# Patient Record
Sex: Female | Born: 1955 | Hispanic: No | Marital: Married | Smoking: Never smoker
Health system: Southern US, Community
[De-identification: ages and names within clinical notes are randomized; demographics above are authoritative.]

## PROBLEM LIST (undated history)

## (undated) DIAGNOSIS — I341 Nonrheumatic mitral (valve) prolapse: Secondary | ICD-10-CM

## (undated) DIAGNOSIS — C50919 Malignant neoplasm of unspecified site of unspecified female breast: Secondary | ICD-10-CM

## (undated) DIAGNOSIS — I10 Essential (primary) hypertension: Secondary | ICD-10-CM

## (undated) HISTORY — DX: Malignant neoplasm of unspecified site of unspecified female breast: C50.919

## (undated) HISTORY — PX: OTHER SURGICAL HISTORY: SHX169

## (undated) HISTORY — DX: Nonrheumatic mitral (valve) prolapse: I34.1

---

## 2014-08-23 ENCOUNTER — Other Ambulatory Visit: Payer: Self-pay | Admitting: Family Medicine

## 2014-08-23 ENCOUNTER — Other Ambulatory Visit (HOSPITAL_COMMUNITY)
Admission: RE | Admit: 2014-08-23 | Discharge: 2014-08-23 | Disposition: A | Discharge status: Home / Self Care | Payer: BC Managed Care – PPO | Source: Ambulatory Visit | Attending: Family Medicine | Admitting: Family Medicine

## 2014-08-23 ENCOUNTER — Other Ambulatory Visit: Payer: Self-pay

## 2014-08-23 DIAGNOSIS — Z124 Encounter for screening for malignant neoplasm of cervix: Secondary | ICD-10-CM | POA: Insufficient documentation

## 2014-08-23 DIAGNOSIS — Z1231 Encounter for screening mammogram for malignant neoplasm of breast: Secondary | ICD-10-CM

## 2014-08-27 LAB — CYTOLOGY - PAP

## 2014-09-13 ENCOUNTER — Ambulatory Visit
Admission: RE | Admit: 2014-09-13 | Discharge: 2014-09-13 | Disposition: A | Discharge status: Home / Self Care | Payer: BC Managed Care – PPO | Source: Ambulatory Visit

## 2014-09-13 DIAGNOSIS — Z1231 Encounter for screening mammogram for malignant neoplasm of breast: Secondary | ICD-10-CM

## 2014-09-19 ENCOUNTER — Encounter: Payer: Self-pay | Admitting: *Deleted

## 2014-09-21 ENCOUNTER — Encounter: Payer: Self-pay | Admitting: Cardiovascular Disease

## 2014-09-21 ENCOUNTER — Ambulatory Visit (INDEPENDENT_AMBULATORY_CARE_PROVIDER_SITE_OTHER): Payer: BC Managed Care – PPO | Admitting: Cardiovascular Disease

## 2014-09-21 VITALS — BP 110/60 | HR 76 | Ht 60.0 in | Wt 121.4 lb

## 2014-09-21 DIAGNOSIS — I10 Essential (primary) hypertension: Secondary | ICD-10-CM

## 2014-09-21 DIAGNOSIS — N184 Chronic kidney disease, stage 4 (severe): Secondary | ICD-10-CM

## 2014-09-21 DIAGNOSIS — I5022 Chronic systolic (congestive) heart failure: Secondary | ICD-10-CM | POA: Insufficient documentation

## 2014-09-21 MED ORDER — LISINOPRIL 20 MG PO TABS: 20.0000 mg | ORAL_TABLET | Freq: Every day | ORAL | Status: DC

## 2014-09-21 MED ORDER — METOPROLOL SUCCINATE ER 25 MG PO TB24: 25.0000 mg | ORAL_TABLET | Freq: Every day | ORAL | Status: DC

## 2014-09-21 NOTE — Patient Instructions (Addendum)
Stop taking Coversyl and Vastarel.  Start Toprol XL (metoprolol succinate) 25mg  daily.   Start Lisinopril 20mg  daily.   Your physician wants you to follow-up in: 6 months with Dr Elease HashimotoNahser. (June 2016).  You will receive a reminder letter in the mail two months in advance. If you don't receive a letter, please call our office to schedule the follow-up appointment.

## 2014-09-21 NOTE — Progress Notes (Addendum)
     Winfield RastJennie Jeffreys Date of Birth  [birth year]       [facility]    [place] [facility] [address]  [address], [address] [place], [place]  [address]   [place]  [id number] [phone]     [phone]   Fax  [phone]     Fax [phone]  Problem List: 1. HTN  History of Present Illness:  Tinnie GensJennie is a 58 yo , referred by Dr. Alfredia FergusonKnodi for evaluation of HTN  She just got married in June to her husband, Wallace CullensGray.  She is here today to establish care in the [place].  [place] is [place]. She exercises in the AM.  Works doing nails. No pain with walking or brinnging groceries from the car. Has slight pain seems to improve with her morning exercises.  Does not eat much salt.    Current Outpatient Prescriptions on File Prior to Visit  Medication Sig Dispense Refill  . Multiple Vitamin (MULTIVITAMIN) capsule Take 1 capsule by mouth daily.     No current facility-administered medications on file prior to visit.    No Known Allergies  Past Medical History  Diagnosis Date  . MVP (mitral valve prolapse)     questionable    Past Surgical History  Procedure Laterality Date  . No surgical hx      History  Smoking status  . Never Smoker   Smokeless tobacco  . Not on file    History  Alcohol Use  . 0.6 oz/week  . 1 Glasses of wine per week    Family History  Problem Relation Age of Onset  . Liver cancer Mother     Reviw of Systems:  Reviewed in the HPI.  All other systems are negative.  Physical Exam: Blood pressure 110/60, pulse 76, height 5' (1.524 m), weight 121 lb 6.4 oz (55.067 kg). Wt Readings from Last 3 Encounters:  09/21/14 121 lb 6.4 oz (55.067 kg)     General: Well developed, well nourished, in no acute distress.  Head: Normocephalic, atraumatic, sclera non-icteric, mucus membranes are moist,   Neck: Supple. Carotids are 2 + without bruits. No JVD   Lungs: Clear   Heart: RR, soft systolic murmur  Abdomen: Soft,  non-tender, non-distended with normal bowel sounds.  Msk:  Strength and tone are normal   Extremities: No clubbing or cyanosis. No edema.  Distal pedal pulses are 2+ and equal    Neuro: CN II - XII intact.  Alert and oriented X 3.   Psych:  Normal   ECG: Dec. 18, 2015;  NSR at 75  Assessment / Plan:

## 2014-09-21 NOTE — Assessment & Plan Note (Signed)
She has chronic systolic CHF. She has improved with medical therapy but her renal insufficiency and elevated BP is likely limiting her improvement. Will start her on Demadex 20 mg BID.  Will check BMP in 3 weeks.  She will need to be seen in 6 weeks.    She needs to get established with a nephrologist. I think her HTN will resolve after she starts dialysis.  We discussed her need for dialysis some day. She became somewhat teary eyed but realizes that it is in her future.

## 2015-01-25 ENCOUNTER — Encounter: Payer: Self-pay | Admitting: Cardiovascular Disease

## 2015-05-04 ENCOUNTER — Other Ambulatory Visit: Payer: Self-pay | Admitting: Cardiovascular Disease

## 2017-10-14 ENCOUNTER — Other Ambulatory Visit: Payer: Self-pay | Admitting: Family Medicine

## 2017-10-14 ENCOUNTER — Other Ambulatory Visit (HOSPITAL_COMMUNITY)
Admission: RE | Admit: 2017-10-14 | Discharge: 2017-10-14 | Disposition: A | Discharge status: Home / Self Care | Payer: Self-pay | Source: Ambulatory Visit | Attending: Family Medicine | Admitting: Family Medicine

## 2017-10-14 DIAGNOSIS — Z1231 Encounter for screening mammogram for malignant neoplasm of breast: Secondary | ICD-10-CM

## 2017-10-14 DIAGNOSIS — Z01411 Encounter for gynecological examination (general) (routine) with abnormal findings: Secondary | ICD-10-CM | POA: Insufficient documentation

## 2017-10-19 ENCOUNTER — Ambulatory Visit: Payer: Self-pay

## 2017-10-19 LAB — CYTOLOGY - PAP: DIAGNOSIS: NEGATIVE

## 2019-11-26 ENCOUNTER — Ambulatory Visit: Payer: Self-pay | Attending: Internal Medicine

## 2019-11-26 DIAGNOSIS — Z23 Encounter for immunization: Secondary | ICD-10-CM | POA: Insufficient documentation

## 2019-11-26 NOTE — Progress Notes (Signed)
   Covid-19 Vaccination Clinic  Name:  Caitlin Burch    MRN: [id number] DOB: 05-14-1956  11/26/2019  Caitlin Burch was observed post Covid-19 immunization for 15 minutes without incidence. She was provided with Vaccine Information Sheet and instruction to access the V-Safe system.   Caitlin Burch was instructed to call 911 with any severe reactions post vaccine: Marland Kitchen Difficulty breathing  . Swelling of your face and throat  . A fast heartbeat  . A bad rash all over your body  . Dizziness and weakness    Immunizations Administered    Name Date Dose VIS Date Route   Pfizer COVID-19 Vaccine 11/26/2019  3:08 PM 0.3 mL 09/15/2019 Intramuscular   Manufacturer: [employer], [employer]   Lot: [id number]   NDC: [address]-3

## 2019-12-27 ENCOUNTER — Ambulatory Visit: Payer: Self-pay | Attending: Internal Medicine

## 2019-12-27 DIAGNOSIS — Z23 Encounter for immunization: Secondary | ICD-10-CM

## 2019-12-27 NOTE — Progress Notes (Signed)
   Covid-19 Vaccination Clinic  Name:  Pennye Beeghly    MRN: [id number] DOB: [birth year]  12/27/2019  Ms. Stange was observed post Covid-19 immunization for 15 minutes without incident. She was provided with Vaccine Information Sheet and instruction to access the V-Safe system.   Ms. Wanamaker was instructed to call 911 with any severe reactions post vaccine: Marland Kitchen Difficulty breathing  . Swelling of face and throat  . A fast heartbeat  . A bad rash all over body  . Dizziness and weakness   Immunizations Administered    Name Date Dose VIS Date Route   Pfizer COVID-19 Vaccine 12/27/2019  3:07 PM 0.3 mL 09/15/2019 Intramuscular   Manufacturer: [employer], [employer]   Lot: [id number]   NDC: [address]-1

## 2020-12-19 DIAGNOSIS — R69 Illness, unspecified: Secondary | ICD-10-CM | POA: Diagnosis not present

## 2020-12-19 DIAGNOSIS — I10 Essential (primary) hypertension: Secondary | ICD-10-CM | POA: Diagnosis not present

## 2020-12-19 DIAGNOSIS — Z809 Family history of malignant neoplasm, unspecified: Secondary | ICD-10-CM | POA: Diagnosis not present

## 2021-05-01 ENCOUNTER — Other Ambulatory Visit: Payer: Self-pay | Admitting: Family Medicine

## 2021-05-01 DIAGNOSIS — R7303 Prediabetes: Secondary | ICD-10-CM | POA: Diagnosis not present

## 2021-05-01 DIAGNOSIS — Z1231 Encounter for screening mammogram for malignant neoplasm of breast: Secondary | ICD-10-CM

## 2021-05-01 DIAGNOSIS — E041 Nontoxic single thyroid nodule: Secondary | ICD-10-CM | POA: Diagnosis not present

## 2021-05-01 DIAGNOSIS — Z79899 Other long term (current) drug therapy: Secondary | ICD-10-CM | POA: Diagnosis not present

## 2021-05-01 DIAGNOSIS — I10 Essential (primary) hypertension: Secondary | ICD-10-CM | POA: Diagnosis not present

## 2021-05-01 DIAGNOSIS — E785 Hyperlipidemia, unspecified: Secondary | ICD-10-CM | POA: Diagnosis not present

## 2021-05-01 DIAGNOSIS — E559 Vitamin D deficiency, unspecified: Secondary | ICD-10-CM | POA: Diagnosis not present

## 2021-05-01 DIAGNOSIS — Z23 Encounter for immunization: Secondary | ICD-10-CM | POA: Diagnosis not present

## 2021-05-01 DIAGNOSIS — K760 Fatty (change of) liver, not elsewhere classified: Secondary | ICD-10-CM | POA: Diagnosis not present

## 2021-05-01 DIAGNOSIS — Z0000 Encounter for general adult medical examination without abnormal findings: Secondary | ICD-10-CM | POA: Diagnosis not present

## 2021-05-01 DIAGNOSIS — Z01818 Encounter for other preprocedural examination: Secondary | ICD-10-CM | POA: Diagnosis not present

## 2021-05-07 ENCOUNTER — Other Ambulatory Visit (HOSPITAL_COMMUNITY): Payer: Self-pay | Admitting: Family Medicine

## 2021-05-27 ENCOUNTER — Ambulatory Visit (HOSPITAL_COMMUNITY)
Admission: RE | Admit: 2021-05-27 | Discharge: 2021-05-27 | Disposition: A | Discharge status: Home / Self Care | Payer: Self-pay | Source: Ambulatory Visit | Attending: Family Medicine | Admitting: Family Medicine

## 2021-05-27 ENCOUNTER — Other Ambulatory Visit: Payer: Self-pay

## 2021-05-27 DIAGNOSIS — I700 Atherosclerosis of aorta: Secondary | ICD-10-CM | POA: Insufficient documentation

## 2021-05-27 DIAGNOSIS — Z136 Encounter for screening for cardiovascular disorders: Secondary | ICD-10-CM | POA: Insufficient documentation

## 2021-05-27 DIAGNOSIS — E785 Hyperlipidemia, unspecified: Secondary | ICD-10-CM | POA: Insufficient documentation

## 2021-09-01 DIAGNOSIS — Z79899 Other long term (current) drug therapy: Secondary | ICD-10-CM | POA: Diagnosis not present

## 2021-09-01 DIAGNOSIS — E2839 Other primary ovarian failure: Secondary | ICD-10-CM | POA: Diagnosis not present

## 2021-09-01 DIAGNOSIS — R7301 Impaired fasting glucose: Secondary | ICD-10-CM | POA: Diagnosis not present

## 2021-09-01 DIAGNOSIS — E785 Hyperlipidemia, unspecified: Secondary | ICD-10-CM | POA: Diagnosis not present

## 2021-09-03 ENCOUNTER — Other Ambulatory Visit: Payer: Self-pay | Admitting: Family Medicine

## 2021-09-03 DIAGNOSIS — E2839 Other primary ovarian failure: Secondary | ICD-10-CM

## 2021-10-23 ENCOUNTER — Ambulatory Visit
Admission: RE | Admit: 2021-10-23 | Discharge: 2021-10-23 | Disposition: A | Discharge status: Home / Self Care | Payer: Medicare HMO | Source: Ambulatory Visit | Attending: Family Medicine | Admitting: Family Medicine

## 2021-10-23 DIAGNOSIS — Z1231 Encounter for screening mammogram for malignant neoplasm of breast: Secondary | ICD-10-CM

## 2022-02-10 ENCOUNTER — Ambulatory Visit
Admission: RE | Admit: 2022-02-10 | Discharge: 2022-02-10 | Disposition: A | Discharge status: Home / Self Care | Payer: Medicare HMO | Source: Ambulatory Visit | Attending: Family Medicine | Admitting: Family Medicine

## 2022-02-10 DIAGNOSIS — Z780 Asymptomatic menopausal state: Secondary | ICD-10-CM | POA: Diagnosis not present

## 2022-02-10 DIAGNOSIS — E2839 Other primary ovarian failure: Secondary | ICD-10-CM

## 2022-02-10 DIAGNOSIS — M810 Age-related osteoporosis without current pathological fracture: Secondary | ICD-10-CM | POA: Diagnosis not present

## 2022-02-10 DIAGNOSIS — M85852 Other specified disorders of bone density and structure, left thigh: Secondary | ICD-10-CM | POA: Diagnosis not present

## 2022-05-25 DIAGNOSIS — I10 Essential (primary) hypertension: Secondary | ICD-10-CM | POA: Diagnosis not present

## 2022-05-25 DIAGNOSIS — E041 Nontoxic single thyroid nodule: Secondary | ICD-10-CM | POA: Diagnosis not present

## 2022-05-25 DIAGNOSIS — E785 Hyperlipidemia, unspecified: Secondary | ICD-10-CM | POA: Diagnosis not present

## 2022-05-25 DIAGNOSIS — R7303 Prediabetes: Secondary | ICD-10-CM | POA: Diagnosis not present

## 2022-05-25 DIAGNOSIS — Z79899 Other long term (current) drug therapy: Secondary | ICD-10-CM | POA: Diagnosis not present

## 2022-05-25 DIAGNOSIS — E559 Vitamin D deficiency, unspecified: Secondary | ICD-10-CM | POA: Diagnosis not present

## 2022-05-27 DIAGNOSIS — E559 Vitamin D deficiency, unspecified: Secondary | ICD-10-CM | POA: Diagnosis not present

## 2022-05-27 DIAGNOSIS — Z0000 Encounter for general adult medical examination without abnormal findings: Secondary | ICD-10-CM | POA: Diagnosis not present

## 2022-05-27 DIAGNOSIS — E785 Hyperlipidemia, unspecified: Secondary | ICD-10-CM | POA: Diagnosis not present

## 2022-05-27 DIAGNOSIS — E041 Nontoxic single thyroid nodule: Secondary | ICD-10-CM | POA: Diagnosis not present

## 2022-05-27 DIAGNOSIS — K760 Fatty (change of) liver, not elsewhere classified: Secondary | ICD-10-CM | POA: Diagnosis not present

## 2022-05-27 DIAGNOSIS — I2510 Atherosclerotic heart disease of native coronary artery without angina pectoris: Secondary | ICD-10-CM | POA: Diagnosis not present

## 2022-05-27 DIAGNOSIS — R7303 Prediabetes: Secondary | ICD-10-CM | POA: Diagnosis not present

## 2022-05-27 DIAGNOSIS — M818 Other osteoporosis without current pathological fracture: Secondary | ICD-10-CM | POA: Diagnosis not present

## 2022-05-27 DIAGNOSIS — Z79899 Other long term (current) drug therapy: Secondary | ICD-10-CM | POA: Diagnosis not present

## 2022-05-27 DIAGNOSIS — I700 Atherosclerosis of aorta: Secondary | ICD-10-CM | POA: Diagnosis not present

## 2022-05-27 DIAGNOSIS — I10 Essential (primary) hypertension: Secondary | ICD-10-CM | POA: Diagnosis not present

## 2022-11-27 ENCOUNTER — Other Ambulatory Visit: Payer: Self-pay | Admitting: Family Medicine

## 2022-11-27 DIAGNOSIS — Z1231 Encounter for screening mammogram for malignant neoplasm of breast: Secondary | ICD-10-CM

## 2023-01-11 ENCOUNTER — Ambulatory Visit
Admission: RE | Admit: 2023-01-11 | Discharge: 2023-01-11 | Disposition: A | Discharge status: Home / Self Care | Payer: Medicare HMO | Source: Ambulatory Visit | Attending: Family Medicine | Admitting: Family Medicine

## 2023-01-11 DIAGNOSIS — Z1231 Encounter for screening mammogram for malignant neoplasm of breast: Secondary | ICD-10-CM | POA: Diagnosis not present

## 2023-01-13 ENCOUNTER — Other Ambulatory Visit: Payer: Self-pay | Admitting: Family Medicine

## 2023-01-13 DIAGNOSIS — R928 Other abnormal and inconclusive findings on diagnostic imaging of breast: Secondary | ICD-10-CM

## 2023-01-25 ENCOUNTER — Ambulatory Visit
Admission: RE | Admit: 2023-01-25 | Discharge: 2023-01-25 | Disposition: A | Discharge status: Home / Self Care | Payer: Medicare HMO | Source: Ambulatory Visit | Attending: Family Medicine | Admitting: Family Medicine

## 2023-01-25 DIAGNOSIS — R928 Other abnormal and inconclusive findings on diagnostic imaging of breast: Secondary | ICD-10-CM

## 2023-01-25 DIAGNOSIS — N6489 Other specified disorders of breast: Secondary | ICD-10-CM | POA: Diagnosis not present

## 2023-01-26 ENCOUNTER — Other Ambulatory Visit: Payer: Self-pay | Admitting: Family Medicine

## 2023-01-26 DIAGNOSIS — N6320 Unspecified lump in the left breast, unspecified quadrant: Secondary | ICD-10-CM

## 2023-01-29 ENCOUNTER — Ambulatory Visit
Admission: RE | Admit: 2023-01-29 | Discharge: 2023-01-29 | Disposition: A | Discharge status: Home / Self Care | Payer: Medicare HMO | Source: Ambulatory Visit | Attending: Family Medicine | Admitting: Family Medicine

## 2023-01-29 DIAGNOSIS — N6320 Unspecified lump in the left breast, unspecified quadrant: Secondary | ICD-10-CM

## 2023-01-29 DIAGNOSIS — N6322 Unspecified lump in the left breast, upper inner quadrant: Secondary | ICD-10-CM | POA: Diagnosis not present

## 2023-01-29 HISTORY — PX: BREAST BIOPSY: SHX20

## 2023-02-01 ENCOUNTER — Telehealth: Payer: Self-pay | Admitting: Hematology and Oncology

## 2023-02-01 NOTE — Telephone Encounter (Signed)
Spoke to patient to confirm upcoming afternoon [facility] clinic appointment on 5/8, paperwork will be sent via mail.   Gave location and time, also informed patient that the surgeon's office would be calling as well to get information from them similar to the packet that they will be receiving so make sure to do both.  Reminded patient that all providers will be coming to the clinic to see them HERE and if they had any questions to not hesitate to reach back out to myself or their navigators. 

## 2023-02-08 ENCOUNTER — Encounter: Payer: Self-pay | Admitting: *Deleted

## 2023-02-08 ENCOUNTER — Other Ambulatory Visit: Payer: Self-pay | Admitting: General Surgery

## 2023-02-08 DIAGNOSIS — Z170 Estrogen receptor positive status [ER+]: Secondary | ICD-10-CM | POA: Insufficient documentation

## 2023-02-09 NOTE — Progress Notes (Signed)
Radiation Oncology         [phone] ________________________________  Initial Outpatient Consultation  Name: Caitlin Burch MRN: [id number]  Date: 02/10/2023  DOB: 10/26/1955  KG:MWNUU, Caitlin Richards, MD  Almond Lint, MD   REFERRING PHYSICIAN: Almond Lint, MD  DIAGNOSIS: No diagnosis found.   Cancer Staging  No matching staging information was found for the patient.  Stage *** Left Breast UIQ, Invasive Ductal Carcinoma, ER+ / PR+ / Her2+, Grade 2  CHIEF COMPLAINT: Here to discuss management of left breast cancer  HISTORY OF PRESENT ILLNESS::Caitlin Burch is a 67 y.o. female who presented with a left breast abnormality on the following imaging: bilateral screening mammogram on the date of 01/11/23. No symptoms, if any, were reported at that time. Left breast diagnostic mammogram and left breast ultrasound on 01/25/23 showed an irregular mass in the 9:30 o'clock left breast, 3 cmfn, measuring 3 x 4 x 3 mm. No left axillary lymphadenopathy was appreciated.   Biopsy of the 9:30 o'clock left breast on date of 01/29/23 showed grade 2 invasive mammary/ductal carcinoma measuring 4 mm in the greatest linear extent of the sample. ER status: 90% positive with strong staining intensity; PR status 30% positive with moderate-strong staining intensity; Proliferation marker Ki67 at 30%; Her2 status positive; Grade 2. No lymph nodes were examined.   ***  PREVIOUS RADIATION THERAPY: No  PAST MEDICAL HISTORY:  has a past medical history of MVP (mitral valve prolapse).    PAST SURGICAL HISTORY: Past Surgical History:  Procedure Laterality Date   BREAST BIOPSY Left 01/29/2023   [place] LT BREAST BX W LOC DEV 1ST LESION IMG BX SPEC US GUIDE 01/29/2023 [facility]   no surgical hx      FAMILY HISTORY: family history includes Liver cancer in her mother.  SOCIAL HISTORY:  reports that she has never smoked. She does not have any smokeless tobacco history on file. She reports current alcohol use  of about 1.0 standard drink of alcohol per week.  ALLERGIES: Patient has no known allergies.  MEDICATIONS:  Current Outpatient Medications  Medication Sig Dispense Refill   lisinopril (PRINIVIL,ZESTRIL) 20 MG tablet TAKE 1 TABLET (20 MG TOTAL) BY MOUTH DAILY. 90 tablet 0   metoprolol succinate (TOPROL-XL) 25 MG 24 hr tablet TAKE 1 TABLET (25 MG TOTAL) BY MOUTH DAILY. 90 tablet 0   Multiple Vitamin (MULTIVITAMIN) capsule Take 1 capsule by mouth daily.     No current facility-administered medications for this encounter.    REVIEW OF SYSTEMS: As above in HPI.   PHYSICAL EXAM:  vitals were not taken for this visit.   General: Alert and oriented, in no acute distress HEENT: Head is normocephalic. Extraocular movements are intact. Oropharynx is clear. Neck: Neck is supple, no palpable cervical or supraclavicular lymphadenopathy. Heart: Regular in rate and rhythm with no murmurs, rubs, or gallops. Chest: Clear to auscultation bilaterally, with no rhonchi, wheezes, or rales. Abdomen: Soft, nontender, nondistended, with no rigidity or guarding. Extremities: No cyanosis or edema. Lymphatics: see Neck Exam Skin: No concerning lesions. Musculoskeletal: symmetric strength and muscle tone throughout. Neurologic: Cranial nerves II through XII are grossly intact. No obvious focalities. Speech is fluent. Coordination is intact. Psychiatric: Judgment and insight are intact. Affect is appropriate. Breasts: *** . No other palpable masses appreciated in the breasts or axillae *** .    ECOG = ***  0 - Asymptomatic (Fully active, able to carry on all predisease activities without restriction)  1 - Symptomatic but completely ambulatory (  Restricted in physically strenuous activity but ambulatory and able to carry out work of a light or sedentary nature. For example, light housework, office work)  2 - Symptomatic, <50% in bed during the day (Ambulatory and capable of all self care but unable to carry  out any work activities. Up and about more than 50% of waking hours)  3 - Symptomatic, >50% in bed, but not bedbound (Capable of only limited self-care, confined to bed or chair 50% or more of waking hours)  4 - Bedbound (Completely disabled. Cannot carry on any self-care. Totally confined to bed or chair)  5 - Death   Santiago Glad MM, Creech RH, Tormey DC, et al. [phone]). "Toxicity and response criteria of the [facility]". Am. Evlyn Clines. Oncol. 5 (6): 649-55   LABORATORY DATA:  No results found for: "WBC", "HGB", "HCT", "MCV", "PLT" CMP  No results found for: "NA", "K", "CL", "CO2", "GLUCOSE", "BUN", "CREATININE", "CALCIUM", "PROT", "ALBUMIN", "AST", "ALT", "ALKPHOS", "BILITOT", "GFRNONAA", "GFRAA"       RADIOGRAPHY: [place] LT BREAST BX W LOC DEV 1ST LESION IMG BX SPEC US GUIDE  Addendum Date: 02/03/2023   ADDENDUM REPORT: 02/03/2023 16:41 ADDENDUM: Pathology revealed GRADE II INVASIVE MAMMARY CARCINOMA of the LEFT breast, 9:30 o'clock, (coil clip). This was found to be concordant by Dr. Laveda Abbe. Pathology results were discussed with the patient and her husband by telephone, per request. The patient reported doing well after the biopsy with tenderness at the site. Post biopsy instructions and care were reviewed and questions were answered. The patient was encouraged to call [facility] for any additional concerns. My direct phone number was provided. The patient was referred to [facility] on Feb 10, 2023. Pathology results reported by Rene Kocher, RN on 02/03/2023. Electronically Signed   By: Harmon Pier M.D.   On: 02/03/2023 16:41   Result Date: 02/03/2023 CLINICAL DATA:  67 year old female presents for tissue sampling of LEFT breast mass. EXAM: ULTRASOUND GUIDED LEFT BREAST CORE NEEDLE BIOPSY COMPARISON:  Previous exam(s). PROCEDURE: I met with the patient and we discussed the  procedure of ultrasound-guided biopsy, including benefits and alternatives. We discussed the high likelihood of a successful procedure. We discussed the risks of the procedure, including infection, bleeding, tissue injury, clip migration, and inadequate sampling. Informed written consent was given. The usual time-out protocol was performed immediately prior to the procedure. Using sterile technique and 1% Lidocaine as local anesthetic, under direct ultrasound visualization, a 12 gauge spring-loaded device was used to perform biopsy of the 0.4 cm mass at the 9:30 position of the LEFT breast 3 cm from the nipple using a MEDIAL approach. At the conclusion of the procedure a COIL shaped tissue marker clip was deployed into the biopsy cavity. Follow up 2 view mammogram was performed and dictated separately. IMPRESSION: Ultrasound guided biopsy of 0.4 cm INNER LEFT breast mass. No apparent complications. Electronically Signed: By: Harmon Pier M.D. On: 01/29/2023 12:17  MM CLIP PLACEMENT LEFT  Result Date: 01/29/2023 CLINICAL DATA:  Evaluate COIL biopsy clip placement following ultrasound-guided LEFT breast biopsy. EXAM: 3D DIAGNOSTIC LEFT MAMMOGRAM POST ULTRASOUND BIOPSY COMPARISON:  Previous exam(s). FINDINGS: 3D Mammographic images were obtained following ultrasound guided biopsy of the 0.4 cm mass within the INNER LEFT breast. The biopsy marking clip is in expected position at the site of biopsy. IMPRESSION: Appropriate positioning of the COIL shaped biopsy marking clip at the site  of biopsy in the INNER LEFT breast. Final Assessment: Post Procedure Mammograms for Marker Placement Electronically Signed   By: Harmon Pier M.D.   On: 01/29/2023 12:29  MM 3D DIAGNOSTIC MAMMOGRAM UNILATERAL LEFT BREAST  Result Date: 01/25/2023 CLINICAL DATA:  The patient was called back from screening mammography due to a new left breast mass EXAM: DIGITAL DIAGNOSTIC UNILATERAL LEFT MAMMOGRAM WITH TOMOSYNTHESIS; ULTRASOUND LEFT  BREAST LIMITED TECHNIQUE: Left digital diagnostic mammography and breast tomosynthesis was performed.; Targeted ultrasound examination of the left breast was performed. COMPARISON:  Previous exam(s). ACR Breast Density Category b: There are scattered areas of fibroglandular density. FINDINGS: There is a new left mass located medially which is high in density and irregular. Targeted ultrasound is performed, showing an irregular mass in the left breast at 9:30, 3 cm from the nipple measuring 3 x 4 x 3 mm. No axillary adenopathy. IMPRESSION: There is a new suspicious left breast mass measuring 4 mm as above. RECOMMENDATION: Recommend ultrasound-guided biopsy of the 9 o'clock left breast mass. I have discussed the findings and recommendations with the patient. If applicable, a reminder letter will be sent to the patient regarding the next appointment. BI-RADS CATEGORY  4: Suspicious. Electronically Signed   By: Gerome Sam III M.D.   On: 01/25/2023 14:52  [place] LIMITED ULTRASOUND INCLUDING AXILLA LEFT BREAST   Result Date: 01/25/2023 CLINICAL DATA:  The patient was called back from screening mammography due to a new left breast mass EXAM: DIGITAL DIAGNOSTIC UNILATERAL LEFT MAMMOGRAM WITH TOMOSYNTHESIS; ULTRASOUND LEFT BREAST LIMITED TECHNIQUE: Left digital diagnostic mammography and breast tomosynthesis was performed.; Targeted ultrasound examination of the left breast was performed. COMPARISON:  Previous exam(s). ACR Breast Density Category b: There are scattered areas of fibroglandular density. FINDINGS: There is a new left mass located medially which is high in density and irregular. Targeted ultrasound is performed, showing an irregular mass in the left breast at 9:30, 3 cm from the nipple measuring 3 x 4 x 3 mm. No axillary adenopathy. IMPRESSION: There is a new suspicious left breast mass measuring 4 mm as above. RECOMMENDATION: Recommend ultrasound-guided biopsy of the 9 o'clock left breast mass. I have  discussed the findings and recommendations with the patient. If applicable, a reminder letter will be sent to the patient regarding the next appointment. BI-RADS CATEGORY  4: Suspicious. Electronically Signed   By: Gerome Sam III M.D.   On: 01/25/2023 14:52  MM 3D SCREEN BREAST BILATERAL  Result Date: 01/12/2023 CLINICAL DATA:  Screening. EXAM: DIGITAL SCREENING BILATERAL MAMMOGRAM WITH TOMOSYNTHESIS AND CAD TECHNIQUE: Bilateral screening digital craniocaudal and mediolateral oblique mammograms were obtained. Bilateral screening digital breast tomosynthesis was performed. The images were evaluated with computer-aided detection. COMPARISON:  Previous exam(s). ACR Breast Density Category a: The breasts are almost entirely fatty. FINDINGS: In the left breast, a possible mass warrants further evaluation. In the right breast, no findings suspicious for malignancy. IMPRESSION: Further evaluation is suggested for a possible mass in the left breast. RECOMMENDATION: Diagnostic mammogram and possibly ultrasound of the left breast. (Code:[id number]) The patient will be contacted regarding the findings, and additional imaging will be scheduled. BI-RADS CATEGORY  0: Incomplete: Need additional imaging evaluation. Electronically Signed   By: Frederico Hamman M.D.   On: 01/12/2023 14:02      IMPRESSION/PLAN: ***   It was a pleasure meeting the patient today. We discussed the risks, benefits, and side effects of radiotherapy. I recommend radiotherapy to the *** to reduce her risk of locoregional recurrence  by 2/3.  We discussed that radiation would take approximately *** weeks to complete and that I would give the patient a few weeks to heal following surgery before starting treatment planning. *** If chemotherapy were to be given, this would precede radiotherapy. We spoke about acute effects including skin irritation and fatigue as well as much less common late effects including internal organ injury or irritation. We  spoke about the latest technology that is used to minimize the risk of late effects for patients undergoing radiotherapy to the breast or chest wall. No guarantees of treatment were given. The patient is enthusiastic about proceeding with treatment. I look forward to participating in the patient's care.  I will await her referral back to me for postoperative follow-up and eventual CT simulation/treatment planning.  On date of service, in total, I spent *** minutes on this encounter. Patient was seen in person.   __________________________________________   Lonie Peak, MD  This document serves as a record of services personally performed by Lonie Peak, MD. It was created on her behalf by Neena Rhymes, a trained medical scribe. The creation of this record is based on the scribe's personal observations and the provider's statements to them. This document has been checked and approved by the attending provider.

## 2023-02-10 ENCOUNTER — Encounter: Payer: Self-pay | Admitting: *Deleted

## 2023-02-10 ENCOUNTER — Ambulatory Visit
Admission: RE | Admit: 2023-02-10 | Discharge: 2023-02-10 | Disposition: A | Discharge status: Home / Self Care | Payer: Medicare HMO | Source: Ambulatory Visit | Attending: Radiation Oncology | Admitting: Radiation Oncology

## 2023-02-10 ENCOUNTER — Telehealth: Payer: Self-pay | Admitting: Genetic Counselor

## 2023-02-10 ENCOUNTER — Encounter: Payer: Self-pay | Admitting: Physical Therapy

## 2023-02-10 ENCOUNTER — Inpatient Hospital Stay: Payer: Medicare HMO

## 2023-02-10 ENCOUNTER — Other Ambulatory Visit: Payer: Self-pay | Admitting: General Surgery

## 2023-02-10 ENCOUNTER — Encounter: Payer: Self-pay | Admitting: Radiation Oncology

## 2023-02-10 ENCOUNTER — Inpatient Hospital Stay: Payer: Medicare HMO | Attending: Hematology and Oncology | Admitting: Hematology and Oncology

## 2023-02-10 ENCOUNTER — Ambulatory Visit: Payer: Medicare HMO | Attending: General Surgery | Admitting: Physical Therapy

## 2023-02-10 ENCOUNTER — Other Ambulatory Visit: Payer: Self-pay

## 2023-02-10 VITALS — BP 127/69 | HR 76 | Temp 97.2°F | Wt 120.4 lb

## 2023-02-10 DIAGNOSIS — Z170 Estrogen receptor positive status [ER+]: Secondary | ICD-10-CM | POA: Diagnosis not present

## 2023-02-10 DIAGNOSIS — C50212 Malignant neoplasm of upper-inner quadrant of left female breast: Secondary | ICD-10-CM | POA: Diagnosis not present

## 2023-02-10 DIAGNOSIS — I341 Nonrheumatic mitral (valve) prolapse: Secondary | ICD-10-CM | POA: Diagnosis not present

## 2023-02-10 DIAGNOSIS — Z79811 Long term (current) use of aromatase inhibitors: Secondary | ICD-10-CM | POA: Insufficient documentation

## 2023-02-10 DIAGNOSIS — Z800 Family history of malignant neoplasm of digestive organs: Secondary | ICD-10-CM | POA: Diagnosis not present

## 2023-02-10 DIAGNOSIS — Z803 Family history of malignant neoplasm of breast: Secondary | ICD-10-CM | POA: Diagnosis not present

## 2023-02-10 DIAGNOSIS — R293 Abnormal posture: Secondary | ICD-10-CM

## 2023-02-10 DIAGNOSIS — Z79899 Other long term (current) drug therapy: Secondary | ICD-10-CM | POA: Insufficient documentation

## 2023-02-10 LAB — CMP (CANCER CENTER ONLY)
ALT: 14 U/L (ref 0–44)
AST: 16 U/L (ref 15–41)
Albumin: 4.6 g/dL (ref 3.5–5.0)
Alkaline Phosphatase: 77 U/L (ref 38–126)
Anion gap: 5 (ref 5–15)
BUN: 12 mg/dL (ref 8–23)
CO2: 33 mmol/L — ABNORMAL HIGH (ref 22–32)
Calcium: 10.1 mg/dL (ref 8.9–10.3)
Chloride: 104 mmol/L (ref 98–111)
Creatinine: 0.81 mg/dL (ref 0.44–1.00)
GFR, Estimated: >60 mL/min (ref >60)
Glucose, Bld: 146 mg/dL — ABNORMAL HIGH (ref 70–99)
Potassium: 4 mmol/L (ref 3.5–5.1)
Sodium: 142 mmol/L (ref 135–145)
Total Bilirubin: 0.5 mg/dL (ref 0.3–1.2)
Total Protein: 7.8 g/dL (ref 6.5–8.1)

## 2023-02-10 LAB — CBC WITH DIFFERENTIAL (CANCER CENTER ONLY)
Abs Immature Granulocytes: 0.03 10*3/uL (ref 0.00–0.07)
Basophils Absolute: 0 10*3/uL (ref 0.0–0.1)
Basophils Relative: 0 %
Eosinophils Absolute: 0.1 10*3/uL (ref 0.0–0.5)
Eosinophils Relative: 2 %
HCT: 39.6 % (ref 36.0–46.0)
Hemoglobin: 13.5 g/dL (ref 12.0–15.0)
Immature Granulocytes: 0 %
Lymphocytes Relative: 26 %
Lymphs Abs: 2.1 10*3/uL (ref 0.7–4.0)
MCH: 32.1 pg (ref 26.0–34.0)
MCHC: 34.1 g/dL (ref 30.0–36.0)
MCV: 94.1 fL (ref 80.0–100.0)
Monocytes Absolute: 0.4 10*3/uL (ref 0.1–1.0)
Monocytes Relative: 5 %
Neutro Abs: 5.4 10*3/uL (ref 1.7–7.7)
Neutrophils Relative %: 67 %
Platelet Count: 237 10*3/uL (ref 150–400)
RBC: 4.21 MIL/uL (ref 3.87–5.11)
RDW: 11.7 % (ref 11.5–15.5)
WBC Count: 8 10*3/uL (ref 4.0–10.5)
nRBC: 0 % (ref 0.0–0.2)

## 2023-02-10 LAB — GENETIC SCREENING ORDER

## 2023-02-10 NOTE — Therapy (Signed)
OUTPATIENT PHYSICAL THERAPY BREAST CANCER BASELINE EVALUATION   Patient Name: Caitlin Burch MRN: [id number] DOB:December 01, 1955, 67 y.o., female Today's Date: 02/10/2023  END OF SESSION:  PT End of Session - 02/10/23 1459     Visit Number 1    Number of Visits 2    Date for PT Re-Evaluation 04/07/23    PT Start Time 1310    PT Stop Time 1328   Also saw pt from [phone] for a total of 38 min   PT Time Calculation (min) 18 min    Activity Tolerance Patient tolerated treatment well    Behavior During Therapy [facility] for tasks assessed/performed             Past Medical History:  Diagnosis Date   Breast cancer (HCC)    MVP (mitral valve prolapse)    questionable   Past Surgical History:  Procedure Laterality Date   BREAST BIOPSY Left 01/29/2023   [place] LT BREAST BX W LOC DEV 1ST LESION IMG BX SPEC US GUIDE 01/29/2023 [facility] MAMMOGRAPHY   no surgical hx     Patient Active Problem List   Diagnosis Date Noted   Malignant neoplasm of upper-inner quadrant of left breast in female, estrogen receptor positive (HCC) 02/08/2023   Chronic systolic CHF (congestive heart failure) (HCC) 09/21/2014   Chronic renal failure, stage 4 (severe) (HCC) 09/21/2014    REFERRING PROVIDER: Dr. Almond Lint  REFERRING DIAG: Left breast cancer  THERAPY DIAG:  Malignant neoplasm of upper-inner quadrant of left breast in female, estrogen receptor positive (HCC)  Abnormal posture  Rationale for Evaluation and Treatment: Rehabilitation  ONSET DATE: 01/11/2023  SUBJECTIVE:                                                                                                                                                                                           SUBJECTIVE STATEMENT: Patient reports she is here today to be seen by her medical team for her newly diagnosed left breast cancer.   PERTINENT HISTORY:  Patient was diagnosed on 01/11/2023 with left grade 2 invasive ductal carcinoma breast cancer. It  measures 4 mm and is located in the upper inner quadrant. It is triple positive with a Ki67 of 30%.   PATIENT GOALS:   reduce lymphedema risk and learn post op HEP.   PAIN:  Are you having pain? Yes: NPRS scale: 4-5/10 Pain location: left shoulder Pain description: achy Aggravating factors: reaching Relieving factors: resting  PRECAUTIONS: Active CA   HAND DOMINANCE: left  WEIGHT BEARING RESTRICTIONS: No  FALLS:  Has patient fallen in last 6 months? No  LIVING ENVIRONMENT: Patient lives  with: her husband Lives in: House/apartment Has following equipment at home: None  OCCUPATION: Retired  LEISURE: She does some stretching  PRIOR LEVEL OF FUNCTION: Independent   OBJECTIVE:  COGNITION: Overall cognitive status: Within functional limits for tasks assessed    POSTURE:  Forward head and rounded shoulders posture  UPPER EXTREMITY AROM/PROM:  A/PROM RIGHT   eval   Shoulder extension 46  Shoulder flexion 157  Shoulder abduction 163  Shoulder internal rotation 74  Shoulder external rotation 79    (Blank rows = not tested)  A/PROM LEFT   eval  Shoulder extension 67  Shoulder flexion 157  Shoulder abduction 153  Shoulder internal rotation 52  Shoulder external rotation 66    (Blank rows = not tested)  CERVICAL AROM: All within normal limits except extension limited 50%   UPPER EXTREMITY STRENGTH: WNL  LYMPHEDEMA ASSESSMENTS:   LANDMARK RIGHT   eval  10 cm proximal to olecranon process 24.9  Olecranon process 21.3  10 cm proximal to ulnar styloid process 18.2  Just proximal to ulnar styloid process 13.3  Across hand at thumb web space 15.4  At base of 2nd digit 5.4  (Blank rows = not tested)  LANDMARK LEFT   eval  10 cm proximal to olecranon process 25.7  Olecranon process 21.4  10 cm proximal to ulnar styloid process 18.4  Just proximal to ulnar styloid process 13.5  Across hand at thumb web space 16.7  At base of 2nd digit 5.6  (Blank rows  = not tested)  L-DEX LYMPHEDEMA SCREENING:  The patient was assessed using the L-Dex machine today to produce a lymphedema index baseline score. The patient will be reassessed on a regular basis (typically every 3 months) to obtain new L-Dex scores. If the score is > 6.5 points away from his/her baseline score indicating onset of subclinical lymphedema, it will be recommended to wear a compression garment for 4 weeks, 12 hours per day and then be reassessed. If the score continues to be > 6.5 points from baseline at reassessment, we will initiate lymphedema treatment. Assessing in this manner has a 95% rate of preventing clinically significant lymphedema.   L-DEX FLOWSHEETS - 02/10/23 1500       L-DEX LYMPHEDEMA SCREENING   Measurement Type Unilateral    L-DEX MEASUREMENT EXTREMITY Upper Extremity    POSITION  Standing    DOMINANT SIDE Right    At Risk Side Left    BASELINE SCORE (UNILATERAL) -0.4             QUICK DASH SURVEY:  Neldon Mc - 02/10/23 0001     Open a tight or new jar No difficulty    Do heavy household chores (wash walls, wash floors) No difficulty    Carry a shopping bag or briefcase No difficulty    Wash your back No difficulty    Use a knife to cut food No difficulty    Recreational activities in which you take some force or impact through your arm, shoulder, or hand (golf, hammering, tennis) No difficulty    During the past week, to what extent has your arm, shoulder or hand problem interfered with your normal social activities with family, friends, neighbors, or groups? Not at all    During the past week, to what extent has your arm, shoulder or hand problem limited your work or other regular daily activities Not at all    Arm, shoulder, or hand pain. Mild    Tingling (pins and  needles) in your arm, shoulder, or hand None    Difficulty Sleeping Mild difficulty    DASH Score 4.55 %              PATIENT EDUCATION:  Education details: Lymphedema risk  reduction and post op shoulder/posture HEP Person educated: Patient Education method: Explanation, Demonstration, Handout Education comprehension: Patient verbalized understanding and returned demonstration  HOME EXERCISE PROGRAM: Patient was instructed today in a home exercise program today for post op shoulder range of motion. These included active assist shoulder flexion in sitting, scapular retraction, wall walking with shoulder abduction, and hands behind head external rotation.  She was encouraged to do these twice a day, holding 3 seconds and repeating 5 times when permitted by her physician.   ASSESSMENT:  CLINICAL IMPRESSION: Patient was diagnosed on 01/11/2023 with left grade 2 invasive ductal carcinoma breast cancer. It measures 4 mm and is located in the upper inner quadrant. It is triple positive with a Ki67 of 30%. Her multidisciplinary medical team met prior to her assessments to determine a recommended treatment plan. She is planning to have a left lumpectomy and sentinel node biopsy followed by radiation and anti-estrogen therapy. She will benefit from a post op PT reassessment to determine needs and from L-Dex screens every 3 months for 2 years to detect subclinical lymphedema.  Pt will benefit from skilled therapeutic intervention to improve on the following deficits: Decreased knowledge of precautions, impaired UE functional use, pain, decreased ROM, postural dysfunction.   PT treatment/interventions: ADL/self-care home management, pt/family education, therapeutic exercise  REHAB POTENTIAL: Excellent  CLINICAL DECISION MAKING: Stable/uncomplicated  EVALUATION COMPLEXITY: Low   GOALS: Goals reviewed with patient? YES  LONG TERM GOALS: (STG=LTG)    Name Target Date Goal status  1 Pt will be able to verbalize understanding of pertinent lymphedema risk reduction practices relevant to her dx specifically related to skin care.  Baseline:  No knowledge 02/10/2023 Achieved at  eval  2 Pt will be able to return demo and/or verbalize understanding of the post op HEP related to regaining shoulder ROM. Baseline:  No knowledge 02/10/2023 Achieved at eval  3 Pt will be able to verbalize understanding of the importance of attending the post op After Breast CA Class for further lymphedema risk reduction education and therapeutic exercise.  Baseline:  No knowledge 02/10/2023 Achieved at eval  4 Pt will demo she has regained full shoulder ROM and function post operatively compared to baselines.  Baseline: See objective measurements taken today. 04/07/2023     PLAN:  PT FREQUENCY/DURATION: EVAL and 1 follow up appointment.   PLAN FOR NEXT SESSION: will reassess 3-4 weeks post op to determine needs.   Patient will follow up at outpatient cancer rehab 3-4 weeks following surgery.  If the patient requires physical therapy at that time, a specific plan will be dictated and sent to the referring physician for approval. The patient was educated today on appropriate basic range of motion exercises to begin post operatively and the importance of attending the After Breast Cancer class following surgery.  Patient was educated today on lymphedema risk reduction practices as it pertains to recommendations that will benefit the patient immediately following surgery.  She verbalized good understanding.    Physical Therapy Information for After Breast Cancer Surgery/Treatment:  Lymphedema is a swelling condition that you may be at risk for in your arm if you have lymph nodes removed from the armpit area.  After a sentinel node biopsy, the risk is approximately  5-9% and is higher after an axillary node dissection.  There is treatment available for this condition and it is not life-threatening.  Contact your physician or physical therapist with concerns. You may begin the 4 shoulder/posture exercises (see additional sheet) when permitted by your physician (typically a week after surgery).  If you  have drains, you may need to wait until those are removed before beginning range of motion exercises.  A general recommendation is to not lift your arms above shoulder height until drains are removed.  These exercises should be done to your tolerance and gently.  This is not a "no pain/no gain" type of recovery so listen to your body and stretch into the range of motion that you can tolerate, stopping if you have pain.  If you are having immediate reconstruction, ask your plastic surgeon about doing exercises as he or she may want you to wait. We encourage you to attend the free one time ABC (After Breast Cancer) class offered by [facility].  You will learn information related to lymphedema risk, prevention and treatment and additional exercises to regain mobility following surgery.  You can call [phone] for more information.  This is offered the 1st and 3rd Monday of each month.  You only attend the class one time. While undergoing any medical procedure or treatment, try to avoid blood pressure being taken or needle sticks from occurring on the arm on the side of cancer.   This recommendation begins after surgery and continues for the rest of your life.  This may help reduce your risk of getting lymphedema (swelling in your arm). An excellent resource for those seeking information on lymphedema is the National Lymphedema Network's web site. It can be accessed at www.lymphnet.org If you notice swelling in your hand, arm or breast at any time following surgery (even if it is many years from now), please contact your doctor or physical therapist to discuss this.  Lymphedema can be treated at any time but it is easier for you if it is treated early on.  If you feel like your shoulder motion is not returning to normal in a reasonable amount of time, please contact your surgeon or physical therapist.  [facility] [phone]. [address],  [place] [address]  ABC CLASS After Breast Cancer Class  After Breast Cancer Class is a specially designed exercise class to assist you in a safe recover after having breast cancer surgery.  In this class you will learn how to get back to full function whether your drains were just removed or if you had surgery a month ago.  This one-time class is held the 1st and 3rd Monday of every month from 11:00 a.m. until 12:00 noon virtually.  This class is FREE and space is limited. For more information or to register for the next available class, call [phone].  Class Goals  Understand specific stretches to improve the flexibility of you chest and shoulder. Learn ways to safely strengthen your upper body and improve your posture. Understand the warning signs of infection and why you may be at risk for an arm infection. Learn about Lymphedema and prevention.  ** You do not attend this class until after surgery.  Drains must be removed to participate  Patient was instructed today in a home exercise program today for post op shoulder range of motion. These included active assist shoulder flexion in sitting, scapular retraction, wall walking with shoulder  abduction, and hands behind head external rotation.  She was encouraged to do these twice a day, holding 3 seconds and repeating 5 times when permitted by her physician.   Bethann Punches, [place] 02/10/23 3:15 PM

## 2023-02-10 NOTE — Telephone Encounter (Signed)
Caitlin Burch was seen by a genetic counselor during the breast multidisciplinary clinic on 02/10/2023. In addition to her personal history of breast cancer, she reported her mother was diagnosed with liver cancer at age 68. She does not meet NCCN criteria for genetic testing at this time. She was still offered genetic counseling and testing but declined. We encourage her to contact us if there are any changes to her personal or family history of cancer. If she meets NCCN criteria based on the updated personal/family history, she would be recommended to have genetic counseling and testing.   Lalla Brothers, MS, [facility] Genetic Counselor [email or web address] (P) [phone]

## 2023-02-10 NOTE — Assessment & Plan Note (Signed)
01/29/2023:Screening mammogram detected left breast mass at 9:30 position measuring 0.4 cm, ultrasound-guided biopsy: Grade 2 IDC ER 90%, PR 30%, HER2 positive 3+ by IHC, Ki-67 30%  Pathology and radiology counseling: Discussed with the patient, the details of pathology including the type of breast cancer,the clinical staging, the significance of ER, PR and HER-2/neu receptors and the implications for treatment. After reviewing the pathology in detail, we proceeded to discuss the different treatment options between surgery, radiation, chemotherapy, antiestrogen therapies.  Treatment plan: Breast conserving surgery with sentinel lymph node biopsy Repeat prognostic panel If the final pathology is greater than 0.5 cm and is HER2 positive then she could benefit from adjuvant chemotherapy with anti-HER2 therapy. Adjuvant radiation Adjuvant antiestrogen therapy  Return to clinic after surgery to discuss final pathology report and determine the treatment plan.

## 2023-02-10 NOTE — Research (Signed)
Trial:  [employer] 2021-05 - Specimen Collection Study to Evaluate Biomarkers in Subjects with Cancer   Patient Caitlin Burch was identified by Dr. Pamelia Hoit as a potential candidate for the above listed study.  This Clinical Research Nurse met with MOMINA NIENABER, [id number], on 02/10/23 in a manner and location that ensures patient privacy to discuss participation in the above listed research study.  Patient is Accompanied by her husband .  A copy of the informed consent document with embedded HIPAA language was provided to the patient.  Patient reads, speaks, and understands [place].   Patient was provided with the business card of this Nurse and encouraged to contact the research team with any questions.  Approximately 5 minutes were spent with the patient reviewing the informed consent documents.  Patient was provided the option of taking informed consent documents home to review and was encouraged to review at their convenience with their support network, including other care providers. Patient took the consent documents home to review. Patient agreed to follow up call on Monday to see if she has any questions and if she is interested in participating.  Domenica Reamer, BSN, RN, [employer] Research Nurse II [phone] 02/10/2023

## 2023-02-10 NOTE — Progress Notes (Signed)
[facility] CONSULT NOTE  Patient Care Team: Gretel Acre, MD as PCP - General (Family Medicine) Pershing Proud, RN as Oncology Nurse Navigator Donnelly Angelica, RN as Oncology Nurse Navigator Almond Lint, MD as Consulting Physician (General Surgery) Serena Croissant, MD as Consulting Physician (Hematology and Oncology) Lonie Peak, MD as Attending Physician (Radiation Oncology)  CHIEF COMPLAINTS/PURPOSE OF CONSULTATION:  Newly diagnosed breast cancer  HISTORY OF PRESENTING ILLNESS:  Caitlin Burch 67 y.o. female is here because of recent diagnosis of left breast cancer.  Patient had routine screening mammogram that detected a left breast mass at 9:30 position measuring 0.4 cm.  Biopsy revealed grade 2 invasive ductal carcinoma that was ER/PR positive HER2 positive with a Ki-67 of 30%.  She was presented this morning to the multidisciplinary tumor board and she is here today to discuss her treatment plan.  She is accompanied by her husband.  I reviewed her records extensively and collaborated the history with the patient.  SUMMARY OF ONCOLOGIC HISTORY: Oncology History  Malignant neoplasm of upper-inner quadrant of left breast in female, estrogen receptor positive (HCC)  01/29/2023 Initial Diagnosis   Screening mammogram detected left breast mass at 9:30 position measuring 0.4 cm, ultrasound-guided biopsy: Grade 2 IDC ER 90%, PR 30%, HER2 positive 3+ by IHC, Ki-67 30%   02/10/2023 Cancer Staging   Staging form: Breast, AJCC 8th Edition - Clinical: Stage IA (cT1a, cN0, cM0, G2, ER+, PR+, HER2+) - Signed by Serena Croissant, MD on 02/10/2023 Histologic grading system: 3 grade system      MEDICAL HISTORY:  Past Medical History:  Diagnosis Date   MVP (mitral valve prolapse)    questionable    SURGICAL HISTORY: Past Surgical History:  Procedure Laterality Date   BREAST BIOPSY Left 01/29/2023   [place] LT BREAST BX W LOC DEV 1ST LESION IMG BX SPEC US GUIDE 01/29/2023 [facility]  MAMMOGRAPHY   no surgical hx      SOCIAL HISTORY: Social History   Socioeconomic History   Marital status: Married    Spouse name: Not on file   Number of children: Not on file   Years of education: Not on file   Highest education level: Not on file  Occupational History   Not on file  Tobacco Use   Smoking status: Never   Smokeless tobacco: Not on file  Substance and Sexual Activity   Alcohol use: Yes    Alcohol/week: 1.0 standard drink of alcohol    Types: 1 Glasses of wine per week   Drug use: Not on file   Sexual activity: Not on file  Other Topics Concern   Not on file  Social History Narrative   Not on file   Social Determinants of Health   Financial Resource Strain: Not on file  Food Insecurity: Not on file  Transportation Needs: Not on file  Physical Activity: Not on file  Stress: Not on file  Social Connections: Not on file  Intimate Partner Violence: Not on file    FAMILY HISTORY: Family History  Problem Relation Age of Onset   Liver cancer Mother    Breast cancer Neg Hx     ALLERGIES:  has No Known Allergies.  MEDICATIONS:  Current Outpatient Medications  Medication Sig Dispense Refill   atorvastatin (LIPITOR) 10 MG tablet Take 10 mg by mouth.     Cholecalciferol (VITAMIN D3) 1000 units CAPS 1 tablet Orally Once a day     Ferrous Sulfate (IRON) 325 (65 Fe) MG  TABS 1 tablet Orally Once a day for 30 day(s)     lisinopril (PRINIVIL,ZESTRIL) 20 MG tablet TAKE 1 TABLET (20 MG TOTAL) BY MOUTH DAILY. 90 tablet 0   metoprolol succinate (TOPROL-XL) 25 MG 24 hr tablet TAKE 1 TABLET (25 MG TOTAL) BY MOUTH DAILY. 90 tablet 0   Multiple Vitamin (MULTIVITAMIN) capsule Take 1 capsule by mouth daily.     No current facility-administered medications for this visit.    REVIEW OF SYSTEMS:   Constitutional: Denies fevers, chills or abnormal night sweats   All other systems were reviewed with the patient and are negative.  PHYSICAL EXAMINATION: ECOG  PERFORMANCE STATUS: 1 - Symptomatic but completely ambulatory  Vitals:   02/10/23 1313  BP: 127/69  Pulse: 76  Temp: (!) 97.2 F (36.2 C)  SpO2: 100%   Filed Weights   02/10/23 1313  Weight: 120 lb 6.4 oz (54.6 kg)    GENERAL:alert, no distress and comfortable     LABORATORY DATA:  I have reviewed the data as listed Lab Results  Component Value Date   WBC 8.0 02/10/2023   HGB 13.5 02/10/2023   HCT 39.6 02/10/2023   MCV 94.1 02/10/2023   PLT 237 02/10/2023   Lab Results  Component Value Date   NA 142 02/10/2023   K 4.0 02/10/2023   CL 104 02/10/2023   CO2 33 (H) 02/10/2023    RADIOGRAPHIC STUDIES: I have personally reviewed the radiological reports and agreed with the findings in the report.  ASSESSMENT AND PLAN:  Malignant neoplasm of upper-inner quadrant of left breast in female, estrogen receptor positive (HCC) 01/29/2023:Screening mammogram detected left breast mass at 9:30 position measuring 0.4 cm, ultrasound-guided biopsy: Grade 2 IDC ER 90%, PR 30%, HER2 positive 3+ by IHC, Ki-67 30%  Pathology and radiology counseling: Discussed with the patient, the details of pathology including the type of breast cancer,the clinical staging, the significance of ER, PR and HER-2/neu receptors and the implications for treatment. After reviewing the pathology in detail, we proceeded to discuss the different treatment options between surgery, radiation, chemotherapy, antiestrogen therapies.  Treatment plan: Breast conserving surgery with sentinel lymph node biopsy Repeat prognostic panel If the final pathology is greater than 0.5 cm and is HER2 positive then she could benefit from adjuvant chemotherapy with anti-HER2 therapy. Adjuvant radiation Adjuvant antiestrogen therapy  Return to clinic after surgery to discuss final pathology report and determine the treatment plan.  All questions were answered. The patient knows to call the clinic with any problems, questions or  concerns.    Tamsen Meek, MD 02/10/23

## 2023-02-15 ENCOUNTER — Telehealth: Payer: Self-pay

## 2023-02-15 ENCOUNTER — Other Ambulatory Visit: Payer: Self-pay | Admitting: General Surgery

## 2023-02-15 DIAGNOSIS — C50212 Malignant neoplasm of upper-inner quadrant of left female breast: Secondary | ICD-10-CM

## 2023-02-15 NOTE — Telephone Encounter (Signed)
[employer] 2021-05 - Specimen Collection Study to Evaluate Biomarkers in Subjects with Cancer   Called to see if patient wanted to participate in the exact science study. The patient's husband answered and confirmed that she was not interested in doing the study.  Felecia Jan, [facility] 02/15/2023 4:02 PM

## 2023-02-17 ENCOUNTER — Encounter: Payer: Self-pay | Admitting: General Practice

## 2023-02-17 NOTE — Progress Notes (Signed)
[facility] Psychosocial Distress Screening Spiritual Care  Met with Makylee and family by phone following Breast Multidisciplinary Clinic to introduce Support Center team/resources, reviewing distress screen per protocol.  The patient scored a 5 on the Psychosocial Distress Thermometer which indicates moderate distress. Also assessed for distress and other psychosocial needs.      02/17/2023   10:29 AM  ONCBCN DISTRESS SCREENING  Screening Type Initial Screening  Distress experienced in past week (1-10) 5  Emotional problem type Adjusting to illness  Information Concerns Type Lack of info about diagnosis;Lack of info about treatment  Referral to support programs Yes    Chaplain and patient discussed common feelings and emotions when being diagnosed with cancer, and the importance of support during treatment.  Chaplain informed patient of the support team and support services at [facility].  Chaplain provided contact information and encouraged patient to call with any questions or concerns.  Family reports being overwhelmed by appointments, but confident in ability to persevere. They note that they don't quite yet know what they may need but plan to phone as needs arise.  Follow up needed: No. Family has direct Spiritual Care number and plans to phone as needed/desired.   [address] Rush Barer, [place], [facility] Pager [phone] Voicemail [phone]

## 2023-02-17 NOTE — Progress Notes (Signed)
Surgical Instructions    Your procedure is scheduled on Tuesday, 02/23/23.  Report to [facility] Main Entrance "A" at 11:00 A.M., then check in with the Admitting office.  Call this number if you have problems the morning of surgery:  [phone]   If you have any questions prior to your surgery date call [phone]: Open Monday-Friday 8am-4pm If you experience any cold or flu symptoms such as cough, fever, chills, shortness of breath, etc. between now and your scheduled surgery, please notify us at the above number     Remember:  Do not eat after midnight the night before your surgery  You may drink clear liquids until 10:00am the morning of your surgery.   Clear liquids allowed are: Water, Non-Citrus Juices (without pulp), Carbonated Beverages, Clear Tea, Black Coffee ONLY (NO MILK, CREAM OR POWDERED CREAMER of any kind), and Gatorade    Take these medicines the morning of surgery with A SIP OF WATER:  atorvastatin (LIPITOR)  metoprolol succinate (TOPROL-XL)   As of today, STOP taking any Aspirin (unless otherwise instructed by your surgeon) Aleve, Naproxen, Ibuprofen, Motrin, Advil, Goody's, BC's, all herbal medications, fish oil, and all vitamins.           Do not wear jewelry or makeup. Do not wear lotions, powders, perfumes or deodorant. Do not shave 48 hours prior to surgery.   Do not bring valuables to the hospital. Do not wear nail polish, gel polish, artificial nails, or any other type of covering on natural nails (fingers and toes) If you have artificial nails or gel coating that need to be removed by a nail salon, please have this removed prior to surgery. Artificial nails or gel coating may interfere with anesthesia's ability to adequately monitor your vital signs.  [facility] is not responsible for any belongings or valuables.    Do NOT Smoke (Tobacco/Vaping)  24 hours prior to your procedure  If you use a CPAP at night, you may bring your mask for your overnight  stay.   Contacts, glasses, hearing aids, dentures or partials may not be worn into surgery, please bring cases for these belongings   For patients admitted to the hospital, discharge time will be determined by your treatment team.   Patients discharged the day of surgery will not be allowed to drive home, and someone needs to stay with them for 24 hours.   SURGICAL WAITING ROOM VISITATION Patients having surgery or a procedure may have no more than 2 support people in the waiting area - these visitors may rotate.   Children under the age of 10 must have an adult with them who is not the patient. If the patient needs to stay at the hospital during part of their recovery, the visitor guidelines for inpatient rooms apply. Pre-op nurse will coordinate an appropriate time for 1 support person to accompany patient in pre-op.  This support person may not rotate.   Please refer to [email or web address] for the visitor guidelines for Inpatients (after your surgery is over and you are in a regular room).    Special instructions:    Oral Hygiene is also important to reduce your risk of infection.  Remember - BRUSH YOUR TEETH THE MORNING OF SURGERY WITH YOUR REGULAR TOOTHPASTE   [facility]- Preparing For Surgery  Before surgery, you can play an important role. Because skin is not sterile, your skin needs to be as free of germs as possible. You can reduce the number of germs on your skin  by washing with CHG (chlorahexidine gluconate) Soap before surgery.  CHG is an antiseptic cleaner which kills germs and bonds with the skin to continue killing germs even after washing.     Please do not use if you have an allergy to CHG or antibacterial soaps. If your skin becomes reddened/irritated stop using the CHG.  Do not shave (including legs and underarms) for at least 48 hours prior to first CHG shower. It is OK to shave your face.  Please follow these  instructions carefully.     Shower the NIGHT BEFORE SURGERY and the MORNING OF SURGERY with CHG Soap.   If you chose to wash your hair, wash your hair first as usual with your normal shampoo. After you shampoo, rinse your hair and body thoroughly to remove the shampoo.  Then [employer] and genitals (private parts) with your normal soap and rinse thoroughly to remove soap.  After that Use CHG Soap as you would any other liquid soap. You can apply CHG directly to the skin and wash gently with a scrungie or a clean washcloth.   Apply the CHG Soap to your body ONLY FROM THE NECK DOWN.  Do not use on open wounds or open sores. Avoid contact with your eyes, ears, mouth and genitals (private parts). Wash Face and genitals (private parts)  with your normal soap.   Wash thoroughly, paying special attention to the area where your surgery will be performed.  Thoroughly rinse your body with warm water from the neck down.  DO NOT shower/wash with your normal soap after using and rinsing off the CHG Soap.  Pat yourself dry with a CLEAN TOWEL.  Wear CLEAN PAJAMAS to bed the night before surgery  Place CLEAN SHEETS on your bed the night before your surgery  DO NOT SLEEP WITH PETS.   Day of Surgery: Take a shower with CHG soap. Wear Clean/Comfortable clothing the morning of surgery Do not apply any deodorants/lotions.   Remember to brush your teeth WITH YOUR REGULAR TOOTHPASTE.    If you received a COVID test during your pre-op visit, it is requested that you wear a mask when out in public, stay away from anyone that may not be feeling well, and notify your surgeon if you develop symptoms. If you have been in contact with anyone that has tested positive in the last 10 days, please notify your surgeon.    Please read over the following fact sheets that you were given.

## 2023-02-18 ENCOUNTER — Encounter (HOSPITAL_COMMUNITY)
Admission: RE | Admit: 2023-02-18 | Discharge: 2023-02-18 | Disposition: A | Discharge status: Home / Self Care | Payer: Medicare HMO | Source: Ambulatory Visit | Attending: General Surgery | Admitting: General Surgery

## 2023-02-18 ENCOUNTER — Encounter (HOSPITAL_COMMUNITY): Payer: Self-pay

## 2023-02-18 ENCOUNTER — Encounter: Payer: Self-pay | Admitting: *Deleted

## 2023-02-18 ENCOUNTER — Telehealth: Payer: Self-pay | Admitting: *Deleted

## 2023-02-18 ENCOUNTER — Other Ambulatory Visit: Payer: Self-pay

## 2023-02-18 VITALS — BP 112/71 | HR 82 | Temp 98.3°F | Resp 16 | Ht <= 58 in | Wt 119.8 lb

## 2023-02-18 DIAGNOSIS — Z01810 Encounter for preprocedural cardiovascular examination: Secondary | ICD-10-CM | POA: Diagnosis not present

## 2023-02-18 DIAGNOSIS — I10 Essential (primary) hypertension: Secondary | ICD-10-CM | POA: Insufficient documentation

## 2023-02-18 DIAGNOSIS — I5022 Chronic systolic (congestive) heart failure: Secondary | ICD-10-CM

## 2023-02-18 DIAGNOSIS — I341 Nonrheumatic mitral (valve) prolapse: Secondary | ICD-10-CM | POA: Insufficient documentation

## 2023-02-18 DIAGNOSIS — C50912 Malignant neoplasm of unspecified site of left female breast: Secondary | ICD-10-CM | POA: Diagnosis not present

## 2023-02-18 HISTORY — DX: Essential (primary) hypertension: I10

## 2023-02-18 NOTE — Anesthesia Preprocedure Evaluation (Addendum)
Anesthesia Evaluation  Patient identified by MRN, date of birth, ID band Patient awake    Reviewed: Allergy & Precautions, NPO status , Patient's Chart, lab work & pertinent test results  History of Anesthesia Complications Negative for: history of anesthetic complications  Airway Mallampati: II  TM Distance: >3 FB Neck ROM: Full    Dental  (+) Teeth Intact, Dental Advisory Given   Pulmonary neg pulmonary ROS   breath sounds clear to auscultation       Cardiovascular hypertension, Pt. on medications and Pt. on home beta blockers (-) angina (-) Past MI  Rhythm:Regular     Neuro/Psych negative neurological ROS  negative psych ROS   GI/Hepatic   Endo/Other  negative endocrine ROS  No results found for: "HGBA1C"   Renal/GU CRFRenal disease     Musculoskeletal negative musculoskeletal ROS (+)    Abdominal   Peds  Hematology negative hematology ROS (+)   Anesthesia Other Findings   Reproductive/Obstetrics                              Anesthesia Physical Anesthesia Plan  ASA: 2  Anesthesia Plan: General and Regional   Post-op Pain Management: Regional block*   Induction: Intravenous  PONV Risk Score and Plan: 3 and Ondansetron, Dexamethasone and Propofol infusion  Airway Management Planned: LMA  Additional Equipment: None  Intra-op Plan:   Post-operative Plan: Extubation in OR  Informed Consent: I have reviewed the patients History and Physical, chart, labs and discussed the procedure including the risks, benefits and alternatives for the proposed anesthesia with the patient or authorized representative who has indicated his/her understanding and acceptance.     Dental advisory given  Plan Discussed with: CRNA  Anesthesia Plan Comments: (PAT note written 02/18/2023 by Shonna Chock, PA-C.  )        Anesthesia Quick Evaluation

## 2023-02-18 NOTE — Telephone Encounter (Signed)
Left vm regarding bmdc from 5.8.24. Request return call with questions or concerns. Contact information provided.

## 2023-02-18 NOTE — Progress Notes (Signed)
PCP - Caitlin Burch Cardiologist - saw dr. Elease Hashimoto back in 2015 but was cleared. Was told she had CHF and CKD 4 in [place] but no signs or tests indicated this when she arrived to the [place].   PPM/ICD - denies  Chest x-ray - n/a EKG - 02/18/23 Stress Test - denies ECHO - denies Cardiac Cath - denies  Sleep Study - denies  ERAS Protcol -yes PRE-SURGERY Ensure or G2- not ordered  COVID TEST- not needed  Anesthesia review: yes, questionable CHF and CKD 4. Caitlin Burch. Came to talk with patient during appt.- no new orders given  Patient denies shortness of breath, fever, cough and chest pain at PAT appointment   All instructions explained to the patient, with a verbal understanding of the material. Patient agrees to go over the instructions while at home for a better understanding. Patient also instructed to self quarantine after being tested for COVID-19. The opportunity to ask questions was provided.

## 2023-02-18 NOTE — Progress Notes (Signed)
Anesthesia APP Evaluation:  Case: [id number] Date/Time: 02/23/23 1245   Procedure: LEFT BREAST LUMPECTOMY WITH RADIOACTIVE SEED AND SENTINEL LYMPH NODE BIOPSY (Left: Breast) - 90 MIN ROOM 2   Anesthesia type: General   Pre-op diagnosis: LEFT BREAST CANCER   [facility] ROOM [facility] / [facility] OR   Surgeons: Almond Lint, MD       DISCUSSION:  Patient is a 67 year old female scheduled for the above procedure.  History includes never smoker, HTN, breast cancer (01/29/23 left breast biopsy: invasive mammary carcinoma), question of MVP history (denied echo).  She moved to the [place] around 2015 from [place]. She was on antihypertensive medications and thought she had been told she had MVP in the past but was not sure.  Her PCP referred her to cardiologist Nahser, Loistine Chance, MD to further evaluate and for recommendations on transitioning medications with those carried in the [place]. He changed her from Arpelar and Coversyl to lisinopril and metoprolol succinate. He noted a soft murmur, but did not order any testing other than an EKG. (Of note, a portion of his assessment/plan appears erroneous as it mentions Demadex which she was not on and was not prescribed and also having chronic systolic CHF and need to start hemodialysis---which she has never been diagnosed with and her renal function has been normal.)   I evaluated her due to question of MVP history. I did not appreciate a murmur on exam. She denied chest pain, SOB, palpitations, edema. She walks for exercise and is able to take 30 minutes walks that include inclines. She also does her own house cleaning. She had CT cardiac scoring per PCP on 05/27/21 which showed score of 23 (in the RCA, otherwise 0 in LM, LAD, LCX; 68th percentile). She is on statin therapy. She remains on lisinopril and Toprol. BP 112/71. She denied any CV symptoms.  Her EKG showed NSR.  RSL is scheduled at [facility] on 02/23/23 at 8:30 AM. Anesthesia team to evaluate on the day of surgery.    VS: BP  112/71   Pulse 82   Temp 36.8 C   Resp 16   Ht 4\' 10"  (1.473 m)   Wt 54.3 kg   SpO2 98%   BMI 25.04 kg/m  Heart RRR, no murmur noted. No carotid bruits noted. Lungs clear. No ankle edema.    PROVIDERS: Gretel Acre, MD is PCP  Serena Croissant, MD is HEM-ONC Lonie Peak, MD is RAD-ONC   LABS: Most recent labs results in [facility] from [facility] include: Lab Results  Component Value Date   WBC 8.0 02/10/2023   HGB 13.5 02/10/2023   HCT 39.6 02/10/2023   PLT 237 02/10/2023   GLUCOSE 146 (H) 02/10/2023   ALT 14 02/10/2023   AST 16 02/10/2023   NA 142 02/10/2023   K 4.0 02/10/2023   CL 104 02/10/2023   CREATININE 0.81 02/10/2023   BUN 12 02/10/2023   CO2 33 (H) 02/10/2023    EKG: 02/18/23: NSR   CV: CT Cardiac Calcium Scoring 05/27/21: FINDINGS: Coronary Calcium Score: Left main: 0 Left anterior descending artery: 0 Left circumflex artery: 0 Right coronary artery: 23 Total: 23 Percentile: 68   Pericardium: Normal. Ascending Aorta: Normal caliber.  Aortic atherosclerosis. Non-cardiac: See separate report from [facility].   IMPRESSION: Coronary calcium score of 23. This was 16 percentile for age-, race-, and sex-matched controls.   Aortic atherosclerosis.   RECOMMENDATIONS: Coronary artery calcium (CAC) score is a strong predictor of incident coronary heart disease (  CHD) and provides predictive information beyond traditional risk factors. CAC scoring is reasonable to use in the decision to withhold, postpone, or initiate statin therapy in intermediate-risk or selected borderline-risk asymptomatic adults (age 65-75 years and LDL-C >=70 to <190 mg/dL) who do not have diabetes or established atherosclerotic cardiovascular disease (ASCVD).* In intermediate-risk (10-year ASCVD risk >=7.5% to <20%) adults or selected borderline-risk (10-year ASCVD risk >=5% to <7.5%) adults in whom a CAC score is measured for the purpose of making a treatment decision the  following recommendations have been made...    If CAC is 1 to 99, it is reasonable to initiate statin therapy for patients >=47 years of age....   Past Medical History:  Diagnosis Date   Breast cancer (HCC)    Hypertension    MVP (mitral valve prolapse)    questionable    Past Surgical History:  Procedure Laterality Date   BREAST BIOPSY Left 01/29/2023   [place] LT BREAST BX W LOC DEV 1ST LESION IMG BX SPEC US GUIDE 01/29/2023 [facility] MAMMOGRAPHY   no surgical hx      MEDICATIONS:  alendronate (FOSAMAX) 70 MG tablet   atorvastatin (LIPITOR) 10 MG tablet   CALCIUM PO   Cholecalciferol (VITAMIN D3) 1000 units CAPS   Cyanocobalamin (B-12 PO)   Ferrous Sulfate (IRON) 325 (65 Fe) MG TABS   lisinopril (PRINIVIL,ZESTRIL) 20 MG tablet   metoprolol succinate (TOPROL-XL) 25 MG 24 hr tablet   Multiple Vitamin (MULTIVITAMIN) capsule   No current facility-administered medications for this encounter.    Shonna Chock, PA-C Surgical Short Stay/Anesthesiology [facility] Phone [phone] [facility] Phone [phone] 02/18/2023 6:04 PM

## 2023-02-22 NOTE — H&P (Signed)
REFERRING PHYSICIAN:  Hu   PROVIDER:  Matthias Hughs, MD   Care Team: Patient Care Team: Suzan Nailer, MD as PCP - General (Family Medicine) Matthias Hughs, MD as Consulting Provider (Surgical Oncology) Sabas Sous, MD (Hematology and Oncology) Buckner Malta, MD (Radiation Oncology)    MRN: [id number] DOB: [birth year] DATE OF ENCOUNTER: 02/10/2023   Subjective    Chief Complaint: Breast Cancer   History of Present Illness: Caitlin Burch is a 67 y.o. female who is seen today as an office consultation at the request of Dr. Pamelia Hoit for evaluation of Breast Cancer      Patient presents with a new diagnosis of left breast cancer in May 2024.  The patient had a small screening detected left breast mass.  Diagnostic imaging was performed and this demonstrated a 4 mm mass at 930 on the left.  The axilla appeared negative by ultrasound.  A core needle biopsy was performed.  This demonstrated grade 2 invasive mammary carcinoma, ductal phenotype.  This was strongly ER and PR positive, HER2 was also positive, and Ki-67 30%.   Pt is from [place].     Family cancer history: none known Menarche 11 Menopause unsure Parity 1   Work retired   Diagnostic mammogram/us: 01/25/2023 [facility] ACR Breast Density Category b: There are scattered areas of fibroglandular density.   FINDINGS: There is a new left mass located medially which is high in density and irregular.   Targeted ultrasound is performed, showing an irregular mass in the left breast at 9:30, 3 cm from the nipple measuring 3 x 4 x 3 mm. No axillary adenopathy.   IMPRESSION: There is a new suspicious left breast mass measuring 4 mm as above.   RECOMMENDATION: Recommend ultrasound-guided biopsy of the 9 o'clock left breast mass.   I have discussed the findings and recommendations with the patient. If applicable, a reminder letter will be sent to the patient regarding the next appointment.   BI-RADS  CATEGORY  4: Suspicious.   Pathology core needle biopsy: 01/29/2023 Breast, left, needle core biopsy, 9:30 INVASIVE MAMMARY CARCINOMA, SEE NOTE TUBULE FORMATION: SCORE 3 NUCLEAR PLEOMORPHISM: SCORE 2 MITOTIC COUNT: SCORE 2 TOTAL SCORE: 7 OVERALL GRADE: 2 LYMPHOVASCULAR INVASION: NOT IDENTIFIED CANCER LENGTH: 0.4 CM CALCIFICATIONS: NOT IDENTIFIED OTHER FINDINGS: NONE Immunohistochemistry for E-cadherin is positive consistent with invasive ductal carcinoma.   Receptors: The tumor cells are positive for Her2 (3+). Estrogen Receptor: 90%, POSITIVE, STRONG STAINING INTENSITY Progesterone Receptor: 30%, POSITIVE, MODERATE-STRONG STAINING INTENSITY Proliferation Marker Ki67: 30%     Review of Systems: A complete review of systems was obtained from the patient.  I have reviewed this information and discussed as appropriate with the patient.  See HPI as well for other ROS. ROS:  dentures, 2 pillows, heartburn, back pain     Medical History: Past Medical History      Past Medical History:  Diagnosis Date   CHF (congestive heart failure) (CMS/HHS-HCC)     Chronic kidney disease     History of cancer          Problem List     Patient Active Problem List  Diagnosis   Malignant neoplasm of upper-inner quadrant of left breast in female, estrogen receptor positive (CMS/HHS-HCC)   Chronic renal failure, stage 4 (severe) (CMS/HHS-HCC)        Past Surgical History       Past Surgical History:  Procedure Laterality Date   .Left Breast Biopsy Left 01/29/2023  Allergies  No Known Allergies     Medications Ordered Prior to Encounter        Current Outpatient Medications on File Prior to Visit  Medication Sig Dispense Refill   alendronate (FOSAMAX) 70 MG tablet Take 70 mg by mouth every 7 (seven) days       atorvastatin (LIPITOR) 10 MG tablet Take 10 mg by mouth once daily       lisinopriL (ZESTRIL) 20 MG tablet Take 20 mg by mouth once daily       metoprolol succinate  (TOPROL-XL) 25 MG XL tablet Take 25 mg by mouth once daily        No current facility-administered medications on file prior to visit.        Family History       Family History  Problem Relation Age of Onset   Liver cancer Mother          Tobacco Use History  Social History       Tobacco Use  Smoking Status Never  Smokeless Tobacco Never        Social History  Social History        Socioeconomic History   Marital status: Married  Tobacco Use   Smoking status: Never   Smokeless tobacco: Never  Vaping Use   Vaping status: Never Used  Substance and Sexual Activity   Alcohol use: Not Currently   Drug use: Never        Objective:         Vitals:    02/10/23 1542  BP: 127/69  Pulse: 76  Temp: 36.2 C (97.2 F)  Weight: 54.6 kg (120 lb 6.4 oz)  Height: 152.4 cm (5')    Body mass index is 23.51 kg/m.     Gen:  No acute distress.  Well nourished and well groomed.   Neurological: Alert and oriented to person, place, and time. Coordination normal.  Head: Normocephalic and atraumatic.  Eyes: Conjunctivae are normal. Pupils are equal, round, and reactive to light. No scleral icterus.  Neck: Normal range of motion. Neck supple. No tracheal deviation or thyromegaly present.  Cardiovascular: Normal rate, regular rhythm, normal heart sounds and intact distal pulses.  Exam reveals no gallop and no friction rub.  No murmur heard. Breast: no palpable masses.  Left breast sl smaller than right.  No LAD. No nipple retraction or nipple discharge.   Respiratory: Effort normal.  No respiratory distress. No chest wall tenderness. Breath sounds normal.  No wheezes, rales or rhonchi.  GI: Soft. Bowel sounds are normal. The abdomen is soft and nontender.  There is no rebound and no guarding.  Musculoskeletal: Normal range of motion. Extremities are nontender.  Lymphadenopathy: No cervical, preauricular, postauricular or axillary adenopathy is present Skin: Skin is warm and  dry. No rash noted. No diaphoresis. No erythema. No pallor. No clubbing, cyanosis, or edema.   Psychiatric: Normal mood and affect. Behavior is normal. Judgment and thought content normal.      Labs CBC nl, CMET with Gluc 149   Assessment and Plan:        ICD-10-CM    1. Malignant neoplasm of upper-inner quadrant of left breast in female, estrogen receptor positive (CMS/HHS-HCC)  Z61.096      Z17.0         Patient has a diagnosis of left breast cancer, clinical T1a N0.  This is triple positive.   The patient is a candidate for breast conservation with seed localized lumpectomy and  sentinel node biopsy.  If the tumor is >5 mm, chemo will be recommended.  She would also received adjuvant XRT and antihormonal tx.     The surgical procedure was described to the patient.  I discussed the incision type and location and that we will need radiology involved with a seed marker 1-2 days pre op..       We (patient and spouse) discussed the risks including bleeding, infection, damage to other structures, need for further procedures/surgeries.  We discussed the risk of seroma.  The patient was advised of an 8-10 % risk of positive margins and needing more surgery.  The patient was advised that these are the most common complications, but that others can occur as well. I discussed the risk of alteration in breast contour or size.  I discussed risk of chronic pain.  There are rare instances of heart/lung issues post op as well as blood clots.      They were advised against taking aspirin or other anti-inflammatory agents/blood thinners the week before surgery.     The risks and benefits of the procedure were described to the patient and she wishes to proceed.    We also discussed cancer nutrition.            Maudry Diego, MD FACS Surgical Oncology, General Surgery, Trauma and Critical Care [facility]

## 2023-02-23 ENCOUNTER — Encounter (HOSPITAL_COMMUNITY): Payer: Self-pay | Admitting: General Surgery

## 2023-02-23 ENCOUNTER — Encounter (HOSPITAL_COMMUNITY)
Admission: RE | Disposition: A | Discharge status: Home / Self Care | Payer: Self-pay | Source: Home / Self Care | Attending: General Surgery

## 2023-02-23 ENCOUNTER — Ambulatory Visit
Admission: RE | Admit: 2023-02-23 | Discharge: 2023-02-23 | Disposition: A | Discharge status: Home / Self Care | Payer: Medicare HMO | Source: Ambulatory Visit | Attending: General Surgery | Admitting: General Surgery

## 2023-02-23 ENCOUNTER — Other Ambulatory Visit: Payer: Self-pay

## 2023-02-23 ENCOUNTER — Ambulatory Visit (HOSPITAL_COMMUNITY)
Admission: RE | Admit: 2023-02-23 | Discharge: 2023-02-23 | Disposition: A | Discharge status: Home / Self Care | Payer: Medicare HMO | Attending: General Surgery | Admitting: General Surgery

## 2023-02-23 ENCOUNTER — Ambulatory Visit (HOSPITAL_BASED_OUTPATIENT_CLINIC_OR_DEPARTMENT_OTHER): Payer: Medicare HMO | Admitting: Certified Registered"

## 2023-02-23 ENCOUNTER — Ambulatory Visit (HOSPITAL_COMMUNITY): Payer: Medicare HMO | Admitting: Vascular Surgery

## 2023-02-23 DIAGNOSIS — I10 Essential (primary) hypertension: Secondary | ICD-10-CM | POA: Insufficient documentation

## 2023-02-23 DIAGNOSIS — C50212 Malignant neoplasm of upper-inner quadrant of left female breast: Secondary | ICD-10-CM | POA: Diagnosis not present

## 2023-02-23 DIAGNOSIS — G8918 Other acute postprocedural pain: Secondary | ICD-10-CM | POA: Diagnosis not present

## 2023-02-23 DIAGNOSIS — Z923 Personal history of irradiation: Secondary | ICD-10-CM | POA: Diagnosis not present

## 2023-02-23 DIAGNOSIS — C50912 Malignant neoplasm of unspecified site of left female breast: Secondary | ICD-10-CM | POA: Diagnosis not present

## 2023-02-23 DIAGNOSIS — Z170 Estrogen receptor positive status [ER+]: Secondary | ICD-10-CM | POA: Insufficient documentation

## 2023-02-23 DIAGNOSIS — Z9889 Other specified postprocedural states: Secondary | ICD-10-CM | POA: Diagnosis not present

## 2023-02-23 DIAGNOSIS — N184 Chronic kidney disease, stage 4 (severe): Secondary | ICD-10-CM | POA: Diagnosis not present

## 2023-02-23 HISTORY — PX: BREAST BIOPSY: SHX20

## 2023-02-23 HISTORY — PX: MASTECTOMY W/ SENTINEL NODE BIOPSY: SHX2001

## 2023-02-23 HISTORY — PX: BREAST LUMPECTOMY WITH RADIOACTIVE SEED AND SENTINEL LYMPH NODE BIOPSY: SHX6550

## 2023-02-23 SURGERY — BREAST LUMPECTOMY WITH RADIOACTIVE SEED AND SENTINEL LYMPH NODE BIOPSY
Anesthesia: General | Site: Breast | Laterality: Left

## 2023-02-23 MED ORDER — ACETAMINOPHEN 160 MG/5ML PO SOLN: 1000.0000 mg | Freq: Once | ORAL | Status: DC | PRN

## 2023-02-23 MED ORDER — DEXAMETHASONE SODIUM PHOSPHATE 10 MG/ML IJ SOLN
INTRAMUSCULAR | Status: AC
  Filled 2023-02-23: qty 1

## 2023-02-23 MED ORDER — MAGTRACE LYMPHATIC TRACER
INTRAMUSCULAR | Status: DC | PRN
  Administered 2023-02-23: 2 mL via INTRAMUSCULAR

## 2023-02-23 MED ORDER — BUPIVACAINE-EPINEPHRINE (PF) 0.25% -1:200000 IJ SOLN
INTRAMUSCULAR | Status: AC
  Filled 2023-02-23: qty 30

## 2023-02-23 MED ORDER — ONDANSETRON HCL 4 MG/2ML IJ SOLN
INTRAMUSCULAR | Status: DC | PRN
  Administered 2023-02-23: 4 mg via INTRAVENOUS

## 2023-02-23 MED ORDER — LIDOCAINE HCL 1 % IJ SOLN
INTRAMUSCULAR | Status: DC | PRN
  Administered 2023-02-23: 10 mL via INTRAMUSCULAR

## 2023-02-23 MED ORDER — OXYCODONE HCL 5 MG PO TABS
5.0000 mg | ORAL_TABLET | Freq: Four times a day (QID) | ORAL | 0 refills | Status: DC | PRN
Start: 2023-02-23 — End: 2023-05-12

## 2023-02-23 MED ORDER — FENTANYL CITRATE (PF) 100 MCG/2ML IJ SOLN: 25.0000 ug | INTRAMUSCULAR | Status: DC | PRN

## 2023-02-23 MED ORDER — ACETAMINOPHEN 500 MG PO TABS: 1000.0000 mg | ORAL_TABLET | Freq: Once | ORAL | Status: DC | PRN

## 2023-02-23 MED ORDER — OXYCODONE HCL 5 MG PO TABS: 5.0000 mg | ORAL_TABLET | Freq: Once | ORAL | Status: DC | PRN

## 2023-02-23 MED ORDER — LACTATED RINGERS IV SOLN: INTRAVENOUS | Status: DC

## 2023-02-23 MED ORDER — EPHEDRINE 5 MG/ML INJ
INTRAVENOUS | Status: AC
  Filled 2023-02-23: qty 5

## 2023-02-23 MED ORDER — DEXAMETHASONE SODIUM PHOSPHATE 10 MG/ML IJ SOLN
INTRAMUSCULAR | Status: DC | PRN
  Administered 2023-02-23: 10 mg via INTRAVENOUS

## 2023-02-23 MED ORDER — LIDOCAINE 2% (20 MG/ML) 5 ML SYRINGE
INTRAMUSCULAR | Status: DC | PRN
  Administered 2023-02-23: 40 mg via INTRAVENOUS

## 2023-02-23 MED ORDER — LIDOCAINE HCL 1 % IJ SOLN
INTRAMUSCULAR | Status: AC
  Filled 2023-02-23: qty 20

## 2023-02-23 MED ORDER — CHLORHEXIDINE GLUCONATE 0.12 % MT SOLN
15.0000 mL | Freq: Once | OROMUCOSAL | Status: AC
  Administered 2023-02-23: 15 mL via OROMUCOSAL
  Filled 2023-02-23: qty 15

## 2023-02-23 MED ORDER — CHLORHEXIDINE GLUCONATE CLOTH 2 % EX PADS: 6.0000 | MEDICATED_PAD | Freq: Once | CUTANEOUS | Status: DC

## 2023-02-23 MED ORDER — ORAL CARE MOUTH RINSE: 15.0000 mL | Freq: Once | OROMUCOSAL | Status: AC

## 2023-02-23 MED ORDER — BUPIVACAINE-EPINEPHRINE (PF) 0.5% -1:200000 IJ SOLN
INTRAMUSCULAR | Status: DC | PRN
  Administered 2023-02-23: 20 mL via PERINEURAL

## 2023-02-23 MED ORDER — FENTANYL CITRATE (PF) 100 MCG/2ML IJ SOLN
INTRAMUSCULAR | Status: AC
  Filled 2023-02-23: qty 2

## 2023-02-23 MED ORDER — FENTANYL CITRATE PF 50 MCG/ML IJ SOSY
50.0000 ug | PREFILLED_SYRINGE | Freq: Once | INTRAMUSCULAR | Status: AC
  Administered 2023-02-23: 50 ug via INTRAVENOUS
  Filled 2023-02-23: qty 1

## 2023-02-23 MED ORDER — PROPOFOL 10 MG/ML IV BOLUS
INTRAVENOUS | Status: AC
  Filled 2023-02-23: qty 20

## 2023-02-23 MED ORDER — PHENYLEPHRINE 80 MCG/ML (10ML) SYRINGE FOR IV PUSH (FOR BLOOD PRESSURE SUPPORT)
PREFILLED_SYRINGE | INTRAVENOUS | Status: DC | PRN
  Administered 2023-02-23: 80 ug via INTRAVENOUS
  Administered 2023-02-23: 160 ug via INTRAVENOUS

## 2023-02-23 MED ORDER — LIDOCAINE-EPINEPHRINE (PF) 1.5 %-1:200000 IJ SOLN
INTRAMUSCULAR | Status: DC | PRN
  Administered 2023-02-23: 5 mL via PERINEURAL

## 2023-02-23 MED ORDER — OXYCODONE HCL 5 MG/5ML PO SOLN: 5.0000 mg | Freq: Once | ORAL | Status: DC | PRN

## 2023-02-23 MED ORDER — 0.9 % SODIUM CHLORIDE (POUR BTL) OPTIME
TOPICAL | Status: DC | PRN
  Administered 2023-02-23: 1000 mL

## 2023-02-23 MED ORDER — PROPOFOL 10 MG/ML IV BOLUS
INTRAVENOUS | Status: DC | PRN
  Administered 2023-02-23: 140 mg via INTRAVENOUS

## 2023-02-23 MED ORDER — CEFAZOLIN SODIUM-DEXTROSE 2-4 GM/100ML-% IV SOLN
2.0000 g | INTRAVENOUS | Status: AC
  Administered 2023-02-23: 2 g via INTRAVENOUS
  Filled 2023-02-23: qty 100

## 2023-02-23 MED ORDER — ACETAMINOPHEN 500 MG PO TABS
1000.0000 mg | ORAL_TABLET | ORAL | Status: AC
  Administered 2023-02-23: 1000 mg via ORAL
  Filled 2023-02-23: qty 2

## 2023-02-23 MED ORDER — PHENYLEPHRINE 80 MCG/ML (10ML) SYRINGE FOR IV PUSH (FOR BLOOD PRESSURE SUPPORT)
PREFILLED_SYRINGE | INTRAVENOUS | Status: AC
  Filled 2023-02-23: qty 10

## 2023-02-23 MED ORDER — ACETAMINOPHEN 10 MG/ML IV SOLN: 1000.0000 mg | Freq: Once | INTRAVENOUS | Status: DC | PRN

## 2023-02-23 MED ORDER — PROPOFOL 500 MG/50ML IV EMUL
INTRAVENOUS | Status: DC | PRN
  Administered 2023-02-23: 150 ug/kg/min via INTRAVENOUS

## 2023-02-23 MED ORDER — MIDAZOLAM HCL 2 MG/2ML IJ SOLN: 1.0000 mg | Freq: Once | INTRAMUSCULAR | Status: AC

## 2023-02-23 MED ORDER — ONDANSETRON HCL 4 MG/2ML IJ SOLN
INTRAMUSCULAR | Status: AC
  Filled 2023-02-23: qty 2

## 2023-02-23 MED ORDER — MIDAZOLAM HCL 2 MG/2ML IJ SOLN
INTRAMUSCULAR | Status: AC
  Administered 2023-02-23: 1 mg via INTRAVENOUS
  Filled 2023-02-23: qty 2

## 2023-02-23 MED ORDER — LIDOCAINE 2% (20 MG/ML) 5 ML SYRINGE
INTRAMUSCULAR | Status: AC
  Filled 2023-02-23: qty 5

## 2023-02-23 SURGICAL SUPPLY — 52 items
ADH SKN CLS APL DERMABOND .7 (GAUZE/BANDAGES/DRESSINGS) ×2
APL PRP STRL LF DISP 70% ISPRP (MISCELLANEOUS) ×2
BAG COUNTER SPONGE SURGICOUNT (BAG) ×2 IMPLANT
BAG SPNG CNTER NS LX DISP (BAG)
BINDER BREAST LRG (GAUZE/BANDAGES/DRESSINGS) IMPLANT
BINDER BREAST XLRG (GAUZE/BANDAGES/DRESSINGS) IMPLANT
BNDG CMPR 5X4 CHSV STRCH STRL (GAUZE/BANDAGES/DRESSINGS) ×2
BNDG COHESIVE 4X5 TAN STRL LF (GAUZE/BANDAGES/DRESSINGS) ×2 IMPLANT
CANISTER SUCT 3000ML PPV (MISCELLANEOUS) ×2 IMPLANT
CHLORAPREP W/TINT 26 (MISCELLANEOUS) ×2 IMPLANT
CLIP TI LARGE 6 (CLIP) ×2 IMPLANT
CLIP TI MEDIUM 24 (CLIP) ×2 IMPLANT
CNTNR URN SCR LID CUP LEK RST (MISCELLANEOUS) IMPLANT
CONT SPEC 4OZ STRL OR WHT (MISCELLANEOUS) ×12
COVER PROBE W GEL 5X96 (DRAPES) ×4 IMPLANT
COVER SURGICAL LIGHT HANDLE (MISCELLANEOUS) ×2 IMPLANT
DERMABOND ADVANCED .7 DNX12 (GAUZE/BANDAGES/DRESSINGS) ×2 IMPLANT
DEVICE DUBIN SPECIMEN MAMMOGRA (MISCELLANEOUS) IMPLANT
DRAPE CHEST BREAST 15X10 FENES (DRAPES) ×2 IMPLANT
DRAPE SURG 17X23 STRL (DRAPES) IMPLANT
ELECT COATED BLADE 2.86 ST (ELECTRODE) ×2 IMPLANT
ELECT REM PT RETURN 9FT ADLT (ELECTROSURGICAL) ×2
ELECTRODE REM PT RTRN 9FT ADLT (ELECTROSURGICAL) ×2 IMPLANT
GLOVE BIO SURGEON STRL SZ 6 (GLOVE) ×2 IMPLANT
GLOVE INDICATOR 6.5 STRL GRN (GLOVE) ×2 IMPLANT
GOWN STRL REUS W/ TWL LRG LVL3 (GOWN DISPOSABLE) ×2 IMPLANT
GOWN STRL REUS W/ TWL XL LVL3 (GOWN DISPOSABLE) ×2 IMPLANT
GOWN STRL REUS W/TWL LRG LVL3 (GOWN DISPOSABLE) ×2
GOWN STRL REUS W/TWL XL LVL3 (GOWN DISPOSABLE) ×2
KIT BASIN OR (CUSTOM PROCEDURE TRAY) ×2 IMPLANT
KIT MARKER MARGIN INK (KITS) ×2 IMPLANT
LIGHT WAVEGUIDE WIDE FLAT (MISCELLANEOUS) IMPLANT
NDL 18GX1X1/2 (RX/OR ONLY) (NEEDLE) IMPLANT
NDL FILTER BLUNT 18X1 1/2 (NEEDLE) IMPLANT
NDL HYPO 25GX1X1/2 BEV (NEEDLE) ×2 IMPLANT
NEEDLE 18GX1X1/2 (RX/OR ONLY) (NEEDLE) IMPLANT
NEEDLE FILTER BLUNT 18X1 1/2 (NEEDLE) IMPLANT
NEEDLE HYPO 25GX1X1/2 BEV (NEEDLE) ×4 IMPLANT
NS IRRIG 1000ML POUR BTL (IV SOLUTION) ×2 IMPLANT
PACK GENERAL/GYN (CUSTOM PROCEDURE TRAY) ×2 IMPLANT
PACK UNIVERSAL I (CUSTOM PROCEDURE TRAY) ×2 IMPLANT
STOCKINETTE IMPERVIOUS 9X36 MD (GAUZE/BANDAGES/DRESSINGS) ×2 IMPLANT
STRIP CLOSURE SKIN 1/2X4 (GAUZE/BANDAGES/DRESSINGS) ×2 IMPLANT
SUT MNCRL AB 4-0 PS2 18 (SUTURE) ×4 IMPLANT
SUT SILK 2 0 SH (SUTURE) IMPLANT
SUT VIC AB 2-0 SH 27 (SUTURE)
SUT VIC AB 2-0 SH 27XBRD (SUTURE) IMPLANT
SUT VIC AB 3-0 SH 8-18 (SUTURE) ×2 IMPLANT
SYR 3ML LL SCALE MARK (SYRINGE) IMPLANT
SYR CONTROL 10ML LL (SYRINGE) ×2 IMPLANT
TOWEL GREEN STERILE (TOWEL DISPOSABLE) ×2 IMPLANT
TOWEL GREEN STERILE FF (TOWEL DISPOSABLE) ×2 IMPLANT

## 2023-02-23 NOTE — Interval H&P Note (Signed)
History and Physical Interval Note:  02/23/2023 12:00 PM  Caitlin Burch  has presented today for surgery, with the diagnosis of LEFT BREAST CANCER.  The various methods of treatment have been discussed with the patient and family. After consideration of risks, benefits and other options for treatment, the patient has consented to  Procedure(s) with comments: LEFT BREAST LUMPECTOMY WITH RADIOACTIVE SEED AND SENTINEL LYMPH NODE BIOPSY (Left) - 90 MIN ROOM 2 as a surgical intervention.  The patient's history has been reviewed, patient examined, no change in status, stable for surgery.  I have reviewed the patient's chart and labs.  Questions were answered to the patient's satisfaction.     Almond Lint

## 2023-02-23 NOTE — Transfer of Care (Signed)
Immediate Anesthesia Transfer of Care Note  Patient: Caitlin Burch  Procedure(s) Performed: LEFT BREAST LUMPECTOMY WITH RADIOACTIVE SEED (Left: Breast) SENTINEL LYMPH NODE BIOPSY (Left: Axilla)  Patient Location: PACU  Anesthesia Type:General  Level of Consciousness: awake  Airway & Oxygen Therapy: Patient Spontanous Breathing and Patient connected to nasal cannula oxygen  Post-op Assessment: Report given to RN and Post -op Vital signs reviewed and stable  Post vital signs: Reviewed and stable  Last Vitals:  Vitals Value Taken Time  BP 96/49 02/23/23 1600  Temp    Pulse 66 02/23/23 1603  Resp 16 02/23/23 1603  SpO2 93 % 02/23/23 1603  Vitals shown include unvalidated device data.  Last Pain:  Vitals:   02/23/23 1355  PainSc: 0-No pain      Patients Stated Pain Goal: 0 (02/23/23 1049)  Complications: No notable events documented.

## 2023-02-23 NOTE — Anesthesia Procedure Notes (Signed)
Procedure Name: LMA Insertion Date/Time: 02/23/2023 2:30 PM  Performed by: Alwyn Ren, CRNAPre-anesthesia Checklist: Patient identified, Emergency Drugs available, Suction available and Patient being monitored Patient Re-evaluated:Patient Re-evaluated prior to induction Oxygen Delivery Method: Circle system utilized Preoxygenation: Pre-oxygenation with 100% oxygen Induction Type: IV induction Ventilation: Mask ventilation without difficulty LMA: LMA inserted LMA Size: 3.0 Number of attempts: 1 Tube secured with: Tape Dental Injury: Teeth and Oropharynx as per pre-operative assessment

## 2023-02-23 NOTE — Discharge Instructions (Signed)
[facility] Office Phone Number [phone]  BREAST BIOPSY/ PARTIAL MASTECTOMY: POST OP INSTRUCTIONS  Always review your discharge instruction sheet given to you by the facility where your surgery was performed.  IF YOU HAVE DISABILITY OR FAMILY LEAVE FORMS, YOU MUST BRING THEM TO THE OFFICE FOR PROCESSING.  DO NOT GIVE THEM TO YOUR DOCTOR.  Take 2 tylenol (acetominophen) three times a day for 3 days.  If you still have pain, add ibuprofen with food in between if able to take this (if you have kidney issues or stomach issues, do not take ibuprofen).  If both of those are not enough, add the narcotic pain pill.  If you find you are needing a lot of this overnight after surgery, call the next morning for a refill.    Prescriptions will not be filled after 5pm or on week-ends. Take your usually prescribed medications unless otherwise directed You should eat very light the first 24 hours after surgery, such as soup, crackers, pudding, etc.  Resume your normal diet the day after surgery. Most patients will experience some swelling and bruising in the breast.  Ice packs and a good support bra will help.  Swelling and bruising can take several days to resolve.  It is common to experience some constipation if taking pain medication after surgery.  Increasing fluid intake and taking a stool softener will usually help or prevent this problem from occurring.  A mild laxative (Milk of Magnesia or Miralax) should be taken according to package directions if there are no bowel movements after 48 hours. Unless discharge instructions indicate otherwise, you may remove your bandages 48 hours after surgery, and you may shower at that time.  You may have steri-strips (small skin tapes) in place directly over the incision.  These strips should be left on the skin at least for for 7-10 days.    ACTIVITIES:  You may resume regular daily activities (gradually increasing) beginning the next day.  Wearing a  good support bra or sports bra (or the breast binder) minimizes pain and swelling.  You may have sexual intercourse when it is comfortable. No heavy lifting for 1-2 weeks (not over around 10 pounds).  You may drive when you no longer are taking prescription pain medication, you can comfortably wear a seatbelt, and you can safely maneuver your car and apply brakes. RETURN TO WORK:  __________3-14 days depending on job. _______________ You should see your doctor in the office for a follow-up appointment approximately two weeks after your surgery.  Your doctor's nurse will typically make your follow-up appointment when she calls you with your pathology report.  Expect your pathology report 3-4 business days after your surgery.  You may call to check if you do not hear from us after three days.   WHEN TO CALL YOUR DOCTOR: Fever over 101.0 Nausea and/or vomiting. Extreme swelling or bruising. Continued bleeding from incision. Increased pain, redness, or drainage from the incision.  The clinic staff is available to answer your questions during regular business hours.  Please don't hesitate to call and ask to speak to one of the nurses for clinical concerns.  If you have a medical emergency, go to the nearest emergency room or call 911.  A surgeon from [facility] is always on call at the hospital.  For further questions, please visit [email or web address]   

## 2023-02-23 NOTE — Anesthesia Procedure Notes (Signed)
Anesthesia Regional Block: Pectoralis block   Pre-Anesthetic Checklist: , timeout performed,  Correct Patient, Correct Site, Correct Laterality,  Correct Procedure, Correct Position, site marked,  Risks and benefits discussed,  Surgical consent,  Pre-op evaluation,  At surgeon's request and post-op pain management  Laterality: Left and Upper  Prep: chloraprep       Needles:  Injection technique: Single-shot      Needle Length: 9cm  Needle Gauge: 22     Additional Needles: Arrow StimuQuik ECHO Echogenic Stimulating PNB Needle  Procedures:,,,, ultrasound used (permanent image in chart),,    Narrative:  Start time: 02/23/2023 1:40 PM End time: 02/23/2023 1:48 PM Injection made incrementally with aspirations every 5 mL.  Performed by: Personally  Anesthesiologist: Val Eagle, MD

## 2023-02-23 NOTE — Op Note (Signed)
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 The risks, benefits, complications, treatment options, and expected outcomes were discussed with the patient. The possibilities of bleeding, infection, the need for additional procedures, failure to diagnose a condition, and creating a complication requiring transfusion or operation were discussed with the patient. The patient concurred with the proposed plan, giving informed consent.  The site of surgery properly noted/marked. The patient was taken to Operating Room # 2, identified, and the procedure verified as left Breast Seed localized Lumpectomy with sentinel lymph node biopsy. The left arm, breast, and chest were prepped and draped in standard fashion. A Time Out was held and the above information confirmed. The MagTrace was injected into the subareolar position.  The lumpectomy was performed by creating a superomedial circumareolar incision near the previously placed radioactive seed.  Dissection was carried down to around the point of maximum signal intensity. The cautery was used to perform the dissection.  Hemostasis was achieved with cautery. The edges of the cavity were marked with large clips.   The specimen was inked with the margin marker paint kit.    Specimen radiography confirmed inclusion of the mammographic lesion, the clip, and the seed.  The background signal in the breast was zero.  Additional margins were taken at the medial, inferior, posterior, and lateral margin(s). The  wound was irrigated and reinspected for hemostasis.   The breast tissue was rearranged and interrupted 2-0 vicryl mastopexy sutures were placed to minimize the defect.  The skin was then closed with 3-0 vicryl in layers and 4-0 monocryl subcuticular suture.    Using a Sentimag probe, left axillary sentinel nodes were identified transcutaneously.  An oblique incision was created below the axillary hairline.  Dissection was carried through the clavipectoral fascia.  Two deep level II axillary sentinel nodes were removed.  Counts per second were 290 and 60.    The background count was negative cps.  The wound was irrigated.  Hemostasis was achieved with cautery.  The axillary incision was closed with a 3-0 vicryl deep dermal interrupted sutures and a 4-0 monocryl subcuticular closure.    Sterile dressings were applied. At the end of the operation, all sponge, instrument, and needle counts were correct.  Findings: grossly clear surgical margins and no adenopathy, posterior margin is pectoralis and anterior margin is skin  Estimated Blood Loss:  min         Specimens: left breast tissue with seed, additional medial, inferior, posterior, and lateral margins, and two left axillary sentinel lymph nodes.             Complications:  None; patient tolerated the procedure well.         Disposition: PACU - hemodynamically stable.         Condition: stable

## 2023-02-24 ENCOUNTER — Encounter (HOSPITAL_COMMUNITY): Payer: Self-pay | Admitting: General Surgery

## 2023-02-25 DIAGNOSIS — C50912 Malignant neoplasm of unspecified site of left female breast: Secondary | ICD-10-CM | POA: Diagnosis not present

## 2023-02-25 LAB — SURGICAL PATHOLOGY

## 2023-02-25 NOTE — Anesthesia Postprocedure Evaluation (Signed)
Anesthesia Post Note  Patient: Caitlin Burch  Procedure(s) Performed: LEFT BREAST LUMPECTOMY WITH RADIOACTIVE SEED (Left: Breast) SENTINEL LYMPH NODE BIOPSY (Left: Axilla)     Patient location during evaluation: PACU Anesthesia Type: General and Regional Level of consciousness: awake and alert Pain management: pain level controlled Vital Signs Assessment: post-procedure vital signs reviewed and stable Respiratory status: spontaneous breathing, nonlabored ventilation and respiratory function stable Cardiovascular status: blood pressure returned to baseline and stable Postop Assessment: no apparent nausea or vomiting Anesthetic complications: no   No notable events documented.  Last Vitals:  Vitals:   02/23/23 1630 02/23/23 1645  BP: 124/76 125/65  Pulse: (!) 59 60  Resp: 16 17  Temp:  36.6 C  SpO2: 98% 98%    Last Pain:  Vitals:   02/23/23 1645  PainSc: 0-No pain                 Deema Juncaj

## 2023-03-01 NOTE — Progress Notes (Signed)
Patient Care Team: Gretel Acre, MD as PCP - General (Family Medicine) Pershing Proud, RN as Oncology Nurse Navigator Donnelly Angelica, RN as Oncology Nurse Navigator Almond Lint, MD as Consulting Physician (General Surgery) Serena Croissant, MD as Consulting Physician (Hematology and Oncology) Lonie Peak, MD as Attending Physician (Radiation Oncology)  DIAGNOSIS:  Encounter Diagnosis  Name Primary?   Malignant neoplasm of upper-inner quadrant of left breast in female, estrogen receptor positive (HCC) Yes    SUMMARY OF ONCOLOGIC HISTORY: Oncology History  Malignant neoplasm of upper-inner quadrant of left breast in female, estrogen receptor positive (HCC)  01/29/2023 Initial Diagnosis   Screening mammogram detected left breast mass at 9:30 position measuring 0.4 cm, ultrasound-guided biopsy: Grade 2 IDC ER 90%, PR 30%, HER2 positive 3+ by IHC, Ki-67 30%   02/10/2023 Cancer Staging   Staging form: Breast, AJCC 8th Edition - Clinical: Stage IA (cT1a, cN0, cM0, G2, ER+, PR+, HER2+) - Signed by Serena Croissant, MD on 02/10/2023 Histologic grading system: 3 grade system   02/26/2023 Surgery   Left lumpectomy: Grade 2 IDC 0.4 cm with DCIS, margins negative, LVI not identified, ER 90%, PR 30%, HER2 3+ positive, Ki-67 30%, 0/4 lymph nodes negative     CHIEF COMPLIANT: Follow-up after surgery  INTERVAL HISTORY: Caitlin Burch is a  67 y.o. female is here because of recent diagnosis of left breast cancer. She presents to the clinic for a follow-up. She reports surgery went well. She does have some numbness under the left axilla.   ALLERGIES:  has No Known Allergies.  MEDICATIONS:  Current Outpatient Medications  Medication Sig Dispense Refill   alendronate (FOSAMAX) 70 MG tablet Take 70 mg by mouth once a week. Take with a full glass of water on an empty stomach.     atorvastatin (LIPITOR) 10 MG tablet Take 10 mg by mouth daily.     CALCIUM PO Take 1 tablet by mouth daily.      Cholecalciferol (VITAMIN D3) 1000 units CAPS Take 1,000 Units by mouth daily.     Cyanocobalamin (B-12 PO) Take 1 tablet by mouth daily.     Ferrous Sulfate (IRON) 325 (65 Fe) MG TABS Take 325 mg by mouth daily.     lisinopril (PRINIVIL,ZESTRIL) 20 MG tablet TAKE 1 TABLET (20 MG TOTAL) BY MOUTH DAILY. 90 tablet 0   metoprolol succinate (TOPROL-XL) 25 MG 24 hr tablet TAKE 1 TABLET (25 MG TOTAL) BY MOUTH DAILY. 90 tablet 0   Multiple Vitamin (MULTIVITAMIN) capsule Take 1 capsule by mouth daily.     oxyCODONE (OXY IR/ROXICODONE) 5 MG immediate release tablet Take 1 tablet (5 mg total) by mouth every 6 (six) hours as needed for severe pain. 10 tablet 0   No current facility-administered medications for this visit.    PHYSICAL EXAMINATION: ECOG PERFORMANCE STATUS: 1 - Symptomatic but completely ambulatory  Vitals:   03/12/23 1023  BP: 109/60  Pulse: 69  Resp: 16  Temp: 98.1 F (36.7 C)  SpO2: 100%   Filed Weights   03/12/23 1023  Weight: 117 lb 8 oz (53.3 kg)     LABORATORY DATA:  I have reviewed the data as listed    Latest Ref Rng & Units 02/10/2023   12:12 PM  CMP  Glucose 70 - 99 mg/dL 161   BUN 8 - 23 mg/dL 12   Creatinine 0.96 - 1.00 mg/dL 0.45   Sodium 409 - 811 mmol/L 142   Potassium 3.5 - 5.1 mmol/L 4.0  Chloride 98 - 111 mmol/L 104   CO2 22 - 32 mmol/L 33   Calcium 8.9 - 10.3 mg/dL 57.8   Total Protein 6.5 - 8.1 g/dL 7.8   Total Bilirubin 0.3 - 1.2 mg/dL 0.5   Alkaline Phos 38 - 126 U/L 77   AST 15 - 41 U/L 16   ALT 0 - 44 U/L 14     Lab Results  Component Value Date   WBC 8.0 02/10/2023   HGB 13.5 02/10/2023   HCT 39.6 02/10/2023   MCV 94.1 02/10/2023   PLT 237 02/10/2023   NEUTROABS 5.4 02/10/2023    ASSESSMENT & PLAN:  Malignant neoplasm of upper-inner quadrant of left breast in female, estrogen receptor positive (HCC) 02/26/2023:Left lumpectomy: Grade 2 IDC 0.4 cm with DCIS, margins negative, LVI not identified, ER 90%, PR 30%, HER2 3+ positive,  Ki-67 30%, 0/4 lymph nodes negative  Pathology counseling: I discussed the final pathology report of the patient provided  a copy of this report. I discussed the margins as well as lymph node surgeries. We also discussed the final staging along with previously performed ER/PR and HER-2/neu testing.  Treatment plan: Since the final tumor size is less than 0.5 cm there is no role of adjuvant anti-HER2 based chemotherapy. Adjuvant radiation therapy Followed by adjuvant antiestrogen therapy  Return to clinic after radiation is complete    No orders of the defined types were placed in this encounter.  The patient has a good understanding of the overall plan. she agrees with it. she will call with any problems that may develop before the next visit here. Total time spent: 30 mins including face to face time and time spent for planning, charting and co-ordination of care   Tamsen Meek, MD 03/12/23    I Janan Ridge am acting as a Neurosurgeon for [employer]  I have reviewed the above documentation for accuracy and completeness, and I agree with the above.

## 2023-03-03 ENCOUNTER — Encounter: Payer: Self-pay | Admitting: *Deleted

## 2023-03-03 ENCOUNTER — Telehealth: Payer: Self-pay | Admitting: Radiation Oncology

## 2023-03-03 DIAGNOSIS — C50212 Malignant neoplasm of upper-inner quadrant of left female breast: Secondary | ICD-10-CM

## 2023-03-03 NOTE — Telephone Encounter (Signed)
5/29 @ 2:24 pm Left voicemail for patient to call our office to be schedule for consult.

## 2023-03-09 NOTE — Progress Notes (Signed)
Location of Breast Cancer: Malignant neoplasm of upper-inner quadrant of left breast in female, estrogen receptor positive   Histology per Pathology Report:  02-23-23 FINAL MICROSCOPIC DIAGNOSIS:  A. 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 BREAST, LEFT ADDITIONAL INFERIOR MARGIN, EXCISION:      Benign breast tissue, negative for malignancy.  C. BREAST, LEFT ADDITIONAL MEDIAL MARGIN, EXCISION:      Ductal carcinoma in situ, solid and cribriform types, nuclear grade 2.      New medial margin is negative for carcinoma.           1 mm from the new medial margin.  D. BREAST, LEFT ADDITIONAL POSTERIOR MARGIN, EXCISION:      Benign breast tissue, negative for malignancy.  E. BREAST, LEFT ADDITIONAL LATERAL MARGIN, EXCISION:      Benign breast tissue, negative for malignancy.  F. LYMPH NODE, LEFT #1, SENTINEL, EXCISION:      One lymph node, negative for metastatic carcinoma (0/1).  G. LYMPH NODE, LEFT #2, SENTINEL, EXCISION:      One lymph node, negative for metastatic carcinoma (0/1).  H. LYMPH NODE, LEFT, SENTINEL, EXCISION:      One lymph node, negative for metastatic carcinoma (0/1).  I. LYMPH NODE, LEFT, SENTINEL, EXCISION:      One lymph node, negative for metastatic carcinoma (0/1).  ONCOLOGY TABLE:  INVASIVE CARCINOMA OF THE BREAST:  Resection  Procedure: Lumpectomy Specimen Laterality: Left Histologic Type: Invasive ductal carcinoma Histologic Grade:      Glandular (Acinar)/Tubular  Differentiation: 3      Nuclear Pleomorphism: 2      Mitotic Rate: 2      Overall Grade: 2 Tumor Size: 0.4 cm (microscopically) Ductal Carcinoma In Situ: Present Lymphatic and/or Vascular Invasion:  Not identified Treatment Effect in the Breast: No known presurgical therapy Margins: All margins negative for invasive carcinoma      Distance from Closest Margin (mm): 2.5 mm      Specify Closest Margin (required only if <15mm): Posterior DCIS Margins: Uninvolved by DCIS      Distance from Closest Margin (mm): 1 mm      Specify Closest Margin (required only if <28mm): medial margin Regional Lymph Nodes: 4      Number of Lymph Nodes Examined: 4      Number of Sentinel Nodes Examined: 4      Number of Lymph Nodes with Macrometastases (>2 mm): 0      Number of Lymph Nodes with Micrometastases: 0      Number of Lymph Nodes with Isolated Tumor Cells (=0.2 mm or =200 cells): 0      Size of Largest Metastatic Deposit (mm): NA      Extranodal Extension: NA Distant Metastasis:      Distant Site(s) Involved: Not applicable Breast Biomarker Testing Performed on Previous Biopsy:      Testing Performed on Case Number: [id number]  Estrogen Receptor: 90%, Positive, strong staining intensity            Progesterone Receptor: 30%, Positive, strong staining intensity            HER2: Positive, 3+            Ki-67: 30% Pathologic Stage Classification (pTNM, AJCC 8th Edition): pT1a, pN0 Representative Tumor Block: A3 Comment(s): None (v4.5.0.0)  Receptor Status: ER(90%), PR (30%), Her2-neu (Positive, 3+), Ki-67(30%)  Did patient present with symptoms (if so, please note symptoms) or was this found on screening mammography?: screening mammogram  Past/Anticipated interventions by surgeon, if any: 02-23-23 Left Breast Radioactive seed localized lumpectomy and sentinel lymph node biopsy   Indications: This patient presents with history of left breast cancer, cT1aN0 grade 2 invasive invasive  mammary (ductal) carcinoma, upper inner quadrant, ER+/PR+/Her2+  Pre-operative Diagnosis: left breast cancer   Post-operative Diagnosis: Same   Surgeon: Almond Lint    Past/Anticipated interventions by medical oncology, if any:  02-10-23 Dr. Pamelia Hoit Oncology History  Malignant neoplasm of upper-inner quadrant of left breast in female, estrogen receptor positive (HCC)  01/29/2023 Initial Diagnosis    Screening mammogram detected left breast mass at 9:30 position measuring 0.4 cm, ultrasound-guided biopsy: Grade 2 IDC ER 90%, PR 30%, HER2 positive 3+ by IHC, Ki-67 30%    02/10/2023 Cancer Staging    Staging form: Breast, AJCC 8th Edition - Clinical: Stage IA (cT1a, cN0, cM0, G2, ER+, PR+, HER2+) - Signed by Serena Croissant, MD on 02/10/2023 Histologic grading system: 3 grade system    ASSESSMENT AND PLAN:  Malignant neoplasm of upper-inner quadrant of left breast in female, estrogen receptor positive (HCC) 01/29/2023:Screening mammogram detected left breast mass at 9:30 position measuring 0.4 cm, ultrasound-guided biopsy: Grade 2 IDC ER 90%, PR 30%, HER2 positive 3+ by IHC, Ki-67 30%   Pathology and radiology counseling: Discussed with the patient, the details of pathology including the type of breast cancer,the clinical staging, the significance of ER, PR and HER-2/neu receptors and the implications for treatment. After reviewing the pathology in detail, we proceeded to discuss the different treatment options between surgery, radiation, chemotherapy, antiestrogen therapies.   Treatment plan: Breast conserving surgery with sentinel lymph node biopsy Repeat prognostic panel If the final pathology is greater than 0.5 cm and is HER2 positive then she could benefit from adjuvant chemotherapy with anti-HER2 therapy. Adjuvant radiation Adjuvant antiestrogen therapy   Return to clinic after surgery to discuss final pathology report and determine the treatment plan.  Lymphedema issues, if any:  none  to report  Pain issues, if any:  no pain to report however numbness remains under left arm from procedure  SAFETY ISSUES: Prior radiation? no Pacemaker/ICD? no Possible current pregnancy?no Is the patient on methotrexate? no  Current Complaints / other details:  has travel plans coming up. Has plans for wraps and sleeves for protection. No major concerns or questions.

## 2023-03-12 ENCOUNTER — Other Ambulatory Visit: Payer: Self-pay

## 2023-03-12 ENCOUNTER — Inpatient Hospital Stay: Payer: Medicare HMO | Attending: Hematology and Oncology | Admitting: Hematology and Oncology

## 2023-03-12 ENCOUNTER — Encounter: Payer: Self-pay | Admitting: *Deleted

## 2023-03-12 VITALS — BP 109/60 | HR 69 | Temp 98.1°F | Resp 16 | Ht <= 58 in | Wt 117.5 lb

## 2023-03-12 DIAGNOSIS — C50212 Malignant neoplasm of upper-inner quadrant of left female breast: Secondary | ICD-10-CM | POA: Insufficient documentation

## 2023-03-12 DIAGNOSIS — R200 Anesthesia of skin: Secondary | ICD-10-CM | POA: Insufficient documentation

## 2023-03-12 DIAGNOSIS — Z79899 Other long term (current) drug therapy: Secondary | ICD-10-CM | POA: Diagnosis not present

## 2023-03-12 DIAGNOSIS — Z170 Estrogen receptor positive status [ER+]: Secondary | ICD-10-CM | POA: Insufficient documentation

## 2023-03-12 NOTE — Assessment & Plan Note (Signed)
02/26/2023:Left lumpectomy: Grade 2 IDC 0.4 cm with DCIS, margins negative, LVI not identified, ER 90%, PR 30%, HER2 3+ positive, Ki-67 30%, 0/4 lymph nodes negative  Pathology counseling: I discussed the final pathology report of the patient provided  a copy of this report. I discussed the margins as well as lymph node surgeries. We also discussed the final staging along with previously performed ER/PR and HER-2/neu testing.  Treatment plan: Since the final tumor size is less than 0.5 cm there is no role of adjuvant anti-HER2 based chemotherapy. Adjuvant radiation therapy Followed by adjuvant antiestrogen therapy  Return to clinic after radiation is complete

## 2023-03-15 ENCOUNTER — Encounter: Payer: Self-pay | Admitting: Rehabilitation

## 2023-03-15 ENCOUNTER — Ambulatory Visit: Payer: Medicare HMO | Attending: General Surgery | Admitting: Rehabilitation

## 2023-03-15 DIAGNOSIS — Z170 Estrogen receptor positive status [ER+]: Secondary | ICD-10-CM | POA: Diagnosis not present

## 2023-03-15 DIAGNOSIS — Z9189 Other specified personal risk factors, not elsewhere classified: Secondary | ICD-10-CM | POA: Insufficient documentation

## 2023-03-15 DIAGNOSIS — Z483 Aftercare following surgery for neoplasm: Secondary | ICD-10-CM | POA: Insufficient documentation

## 2023-03-15 DIAGNOSIS — C50212 Malignant neoplasm of upper-inner quadrant of left female breast: Secondary | ICD-10-CM | POA: Diagnosis not present

## 2023-03-15 NOTE — Therapy (Signed)
OUTPATIENT PHYSICAL THERAPY BREAST CANCER POST OP FOLLOW UP   Patient Name: Caitlin Burch MRN: [id number] DOB:01/11/1956, 67 y.o., female Today's Date: 03/15/2023  END OF SESSION:  PT End of Session - 03/15/23 1234     Visit Number 2    Number of Visits 2    Date for PT Re-Evaluation 04/07/23    PT Start Time 1200    PT Stop Time 1234    PT Time Calculation (min) 34 min    Activity Tolerance Patient tolerated treatment well    Behavior During Therapy [facility] for tasks assessed/performed             Past Medical History:  Diagnosis Date   Breast cancer (HCC)    Hypertension    MVP (mitral valve prolapse)    questionable   Past Surgical History:  Procedure Laterality Date   BREAST BIOPSY Left 01/29/2023   [place] LT BREAST BX W LOC DEV 1ST LESION IMG BX SPEC US GUIDE 01/29/2023 [facility] M[facility]MO[facility]AP[facility]   BREAST BIOPSY  02/23/2023   MM LT RADIOACTIVE SEED LOC MAMMO GUIDE 02/23/2023 [facility] MAMMOGR[facility]   BREAST LUMPECTOMY WITH RADIOACTIVE SEED AND SENTINEL LYMPH NODE BIOPSY Left 02/23/2023   Procedure: LEFT BREAST LUMPECTOMY WITH RADIOACTIVE SEED;  Surgeon: Almond Lint, MD;  Location: [facility] OR;  Service: General;  Laterality: Left;  90 MIN ROOM 2   MASTECTOMY W/ SENTINEL NODE BIOPSY Left 02/23/2023   Procedure: SENTINEL LYMPH NODE BIOPSY;  Surgeon: Almond Lint, MD;  Location: [facility] OR;  Service: General;  Laterality: Left;   no surgical hx     Patient Active Problem List   Diagnosis Date Noted   Malignant neoplasm of upper-inner quadrant of left breast in female, estrogen receptor positive (HCC) 02/08/2023   Chronic systolic CHF (congestive heart failure) (HCC) 09/21/2014   Chronic renal failure, stage 4 (severe) (HCC) 09/21/2014    REFERRING PROVIDER: Dr. Almond Lint  REFERRING DIAG: Left breast cancer  THERAPY DIAG:  Malignant neoplasm of upper-inner quadrant of left breast in female, estrogen receptor positive (HCC)  Aftercare following surgery for neoplasm  At risk for  lymphedema  Rationale for Evaluation and Treatment: Rehabilitation  ONSET DATE: 01/11/23  SUBJECTIVE:                                                                                                                                                                                           SUBJECTIVE STATEMENT: It is doing okay.  Still sore and numb.  I am back to regular bra now.    PERTINENT HISTORY:  Left lumpectomy on 02/23/23 with 4 negative nodes removed.  Patient was  diagnosed on 01/11/2023 with left grade 2 invasive ductal carcinoma breast cancer. It measures 4 mm and is located in the upper inner quadrant. It is triple positive with a Ki67 of 30%.   PATIENT GOALS:  Reassess how my recovery is going related to arm function, pain, and swelling.  PAIN:  Are you having pain? Yes: NPRS scale: 1/10 Pain location: Lt armpit  Pain description: tender Aggravating factors: nothing  Relieving factors: nothing   PRECAUTIONS: Recent Surgery, left UE Lymphedema risk  ACTIVITY LEVEL / LEISURE: not back to laundry yet   OBJECTIVE:   PATIENT SURVEYS:  QUICK DASH: 9%   OBSERVATIONS: Mild cording noted in Lt axilla but not limiting motion - healed incisions  POSTURE:  WNL  LYMPHEDEMA ASSESSMENT:   UPPER EXTREMITY AROM/PROM:   A/PROM RIGHT   eval    Shoulder extension 46  Shoulder flexion 157  Shoulder abduction 163  Shoulder internal rotation 74  Shoulder external rotation 79                          (Blank rows = not tested)   A/PROM LEFT   eval 03/15/23  Shoulder extension 67 70  Shoulder flexion 157 165 - slight cording noted in the axilla   Shoulder abduction 153 165  Shoulder internal rotation 52   Shoulder external rotation 66 70                          (Blank rows = not tested)   CERVICAL AROM: All within normal limits except extension limited 50%    UPPER EXTREMITY STRENGTH: WNL   LYMPHEDEMA ASSESSMENTS:    LANDMARK RIGHT   eval 03/15/23  10 cm proximal to  olecranon process 24.9   Olecranon process 21.3   10 cm proximal to ulnar styloid process 18.2   Just proximal to ulnar styloid process 13.3   Across hand at thumb web space 15.4   At base of 2nd digit 5.4   (Blank rows = not tested)   LANDMARK LEFT   eval 03/15/23  10 cm proximal to olecranon process 25.7 25  Olecranon process 21.4 22.5  10 cm proximal to ulnar styloid process 18.4 18.5  Just proximal to ulnar styloid process 13.5 14  Across hand at thumb web space 16.7 18  At base of 2nd digit 5.6 5.8  (Blank rows = not tested)  PATIENT EDUCATION:  Education details: use of sleeve for long flights, post op instruction  Person educated: Patient and Spouse Education method: Explanation, Facilities manager, and Handouts Education comprehension: verbalized understanding  HOME EXERCISE PROGRAM: Reviewed previously given post op HEP.   ASSESSMENT:  CLINICAL IMPRESSION: Pt presents post operatively with mild edema in the axilla and slight cording but pt feels like she is doing well and would not like to do any in person appointments.  Pt flies to [place] and they are flying to [place] ina few weeks so she was given ordering information for a medi harmony 20-45mmhg sleeve size 1.    Pt will benefit from skilled therapeutic intervention to improve on the following deficits: Decreased knowledge of precautions, impaired UE functional use, pain, decreased ROM, postural dysfunction.   PT treatment/interventions: ADL/Self care home management, Therapeutic exercises, Patient/Family education, Manual therapy, and Re-evaluation   GOALS: Goals reviewed with patient? Yes  LONG TERM GOALS:  (STG=LTG)  GOALS Name Target Date  Goal status  1 Pt will demonstrate  she has regained full shoulder ROM and function post operatively compared to baselines.  Baseline: 03/15/23 INITIAL                    PLAN:  PT FREQUENCY/DURATION: SOZO only  PLAN FOR NEXT SESSION: SOZO only   [facility]  [address], [address]  [place] [address]  [phone]  After Breast Cancer Class It is recommended you attend the ABC class to be educated on lymphedema risk reduction. This class is free of charge and lasts for 1 hour. It is a 1-time class. You will need to download the TEAMS app either on your phone or computer. We will send you a link the night before or the morning of the class. You should be able to click on that link to join the class. This is not a confidential class. You don't have to turn your camera on, but other participants may be able to see your email address.  Scar massage You can begin gentle scar massage to you incision sites. Gently place one hand on the incision and move the skin (without sliding on the skin) in various directions. Do this for a few minutes and then you can gently massage either coconut oil or vitamin E cream into the scars.  Compression garment You should continue wearing your compression bra until you feel like you no longer have swelling.  Home exercise Program Continue doing the exercises you were given until you feel like you can do them without feeling any tightness at the end.   Walking Program Studies show that 30 minutes of walking per day (fast enough to elevate your heart rate) can significantly reduce the risk of a cancer recurrence. If you can't walk due to other medical reasons, we encourage you to find another activity you could do (like a stationary bike or water exercise).  Posture After breast cancer surgery, people frequently sit with rounded shoulders posture because it puts their incisions on slack and feels better. If you sit like this and scar tissue forms in that position, you can become very tight and have pain sitting or standing with good posture. Try to be aware of your posture and sit and stand up tall to heal properly.  Follow up PT: It is recommended you return every 3 months for the first 3 years  following surgery to be assessed on the SOZO machine for an L-Dex score. This helps prevent clinically significant lymphedema in 95% of patients. These follow up screens are 10 minute appointments that you are not billed for.  Idamae Lusher, PT 03/15/2023, 12:34 PM

## 2023-03-18 ENCOUNTER — Telehealth: Payer: Self-pay

## 2023-03-18 NOTE — Telephone Encounter (Signed)
Rn called pt husband to verify when pt appointment was. He thought appointment might have been on Monday, though has not sure. Rn informed pt that the appointment was at 10:30 on Tuesday the 18th. Rn also reminded them of CT sim that same day. Pt and her husband were grateful for the phone call.

## 2023-03-22 NOTE — Progress Notes (Signed)
Radiation Oncology         [phone] ________________________________  Name: Caitlin Burch MRN: [id number]  Date: 03/23/2023  DOB: Jan 02, 1956  Follow-Up Visit Note  Outpatient  CC: Gretel Acre, MD  Serena Croissant, MD  Diagnosis:   No diagnosis found.   Stage IA Left Breast UIQ, Invasive Ductal Carcinoma, ER+ / PR+ / Her2+, Grade 2: s/p left lumpectomy and SLN biopsies  CHIEF COMPLAINT: Here to discuss management of left breast cancer  Narrative:  The patient returns today for follow-up.     Since her breast clinic consultation date of 02/10/23, she opted to proceed with a left breast lumpectomy and SLN biopsies on 02/23/23 under the care of Dr. Donell Beers. Pathology from the procedure revealed: tumor size of 4 mm; histology of grade 2 invasive ductal carcinoma with DCIS; all margins negative for invasive and in situ carcinoma; margin status to invasive disease of 2.5 mm from the posterior margin; margin status to in situ disease of 1 mm from the medial margin; nodal status of 4/4 left axillary sentinel lymph node excisions negative for carcinoma;  ER status: 90% positive and PR status 30% positive, both with strong staining intensity; Proliferation marker Ki67 at 30%; Her2 status positive; Grade 2.  Even though she is Her2 positive, Dr. Pamelia Hoit does not recommend adjuvant (anti-Her2 based) chemotherapy given that her final tumor size is less than 5 mm. She will return to Dr. Pamelia Hoit following XRT to discuss antiestrogen treatment options.   Post-operatively, she has endorsed some numbness to the left axilla and has some mild bruising to the surgical site. She is otherwise healing well.   Symptomatically, the patient reports: ***        ALLERGIES:  has No Known Allergies.  Meds: Current Outpatient Medications  Medication Sig Dispense Refill   alendronate (FOSAMAX) 70 MG tablet Take 70 mg by mouth once a week. Take with a full glass of water on an empty stomach.     atorvastatin  (LIPITOR) 10 MG tablet Take 10 mg by mouth daily.     CALCIUM PO Take 1 tablet by mouth daily.     Cholecalciferol (VITAMIN D3) 1000 units CAPS Take 1,000 Units by mouth daily.     Cyanocobalamin (B-12 PO) Take 1 tablet by mouth daily.     Ferrous Sulfate (IRON) 325 (65 Fe) MG TABS Take 325 mg by mouth daily.     lisinopril (PRINIVIL,ZESTRIL) 20 MG tablet TAKE 1 TABLET (20 MG TOTAL) BY MOUTH DAILY. 90 tablet 0   metoprolol succinate (TOPROL-XL) 25 MG 24 hr tablet TAKE 1 TABLET (25 MG TOTAL) BY MOUTH DAILY. 90 tablet 0   Multiple Vitamin (MULTIVITAMIN) capsule Take 1 capsule by mouth daily.     oxyCODONE (OXY IR/ROXICODONE) 5 MG immediate release tablet Take 1 tablet (5 mg total) by mouth every 6 (six) hours as needed for severe pain. 10 tablet 0   No current facility-administered medications for this encounter.    Physical Findings:  vitals were not taken for this visit. .     General: Alert and oriented, in no acute distress HEENT: Head is normocephalic. Extraocular movements are intact. Oropharynx is clear. Neck: Neck is supple, no palpable cervical or supraclavicular lymphadenopathy. Heart: Regular in rate and rhythm with no murmurs, rubs, or gallops. Chest: Clear to auscultation bilaterally, with no rhonchi, wheezes, or rales. Abdomen: Soft, nontender, nondistended, with no rigidity or guarding. Extremities: No cyanosis or edema. Lymphatics: see Neck Exam Musculoskeletal: symmetric strength and muscle  tone throughout. Neurologic: No obvious focalities. Speech is fluent.  Psychiatric: Judgment and insight are intact. Affect is appropriate. Breast exam reveals ***  Lab Findings: Lab Results  Component Value Date   WBC 8.0 02/10/2023   HGB 13.5 02/10/2023   HCT 39.6 02/10/2023   MCV 94.1 02/10/2023   PLT 237 02/10/2023    @LASTCHEMISTRY @  Radiographic Findings: MM Breast Surgical Specimen  Result Date: 02/23/2023 CLINICAL DATA:  Specimen radiograph status post left breast  lumpectomy. EXAM: SPECIMEN RADIOGRAPH OF THE LEFT BREAST COMPARISON:  Previous exam(s). FINDINGS: Status post excision of the left breast. The radioactive seed and biopsy marker clip are and completely intact within the specimen. These findings were communicated to the OR at 3:14 p.m. IMPRESSION: Specimen radiograph of the left breast. Electronically Signed   By: Frederico Hamman M.D.   On: 02/23/2023 15:14  MM LT RADIOACTIVE SEED LOC MAMMO GUIDE  Result Date: 02/23/2023 CLINICAL DATA:  Localization prior to surgery EXAM: MAMMOGRAPHIC GUIDED RADIOACTIVE SEED LOCALIZATION OF THE LEFT BREAST COMPARISON:  Previous exam(s). FINDINGS: Patient presents for radioactive seed localization prior to . I met with the patient and we discussed the procedure of seed localization including benefits and alternatives. We discussed the high likelihood of a successful procedure. We discussed the risks of the procedure including infection, bleeding, tissue injury and further surgery. We discussed the low dose of radioactivity involved in the procedure. Informed, written consent was given. The usual time-out protocol was performed immediately prior to the procedure. Using mammographic guidance, sterile technique, 1% lidocaine and an I-125 radioactive seed, was localized using a approach. The follow-up mammogram images confirm the seed in the expected location and were marked for Dr. Follow-up survey of the patient confirms presence of the radioactive seed. Order number of I-125 seed:  [id number]. Total activity:  0.241 millicuries reference Date: February 01, 2023 The patient tolerated the procedure well and was [facility]. She was given instructions regarding seed removal. IMPRESSION: Radioactive seed localization left breast. No apparent complications. Electronically Signed   By: Gerome Sam III M.D.   On: 02/23/2023 09:28   Impression/Plan: We discussed adjuvant radiotherapy today.  I recommend *** in  order to ***.  I reviewed the logistics, benefits, risks, and potential side effects of this treatment in detail. Risks may include but not necessary be limited to acute and late injury tissue in the radiation fields such as skin irritation (change in color/pigmentation, itching, dryness, pain, peeling). She may experience fatigue. We also discussed possible risk of long term cosmetic changes or scar tissue. There is also a smaller risk for lung toxicity, ***cardiac toxicity, ***brachial plexopathy, ***lymphedema, ***musculoskeletal changes, ***rib fragility or ***induction of a second malignancy, ***late chronic non-healing soft tissue wound.    The patient asked good questions which I answered to her satisfaction. She is enthusiastic about proceeding with treatment. A consent form has been *** signed and placed in her chart.  A total of *** medically necessary complex treatment devices will be fabricated and supervised by me: *** fields with MLCs for custom blocks to protect heart, and lungs;  and, a Vac-lok. MORE COMPLEX DEVICES MAY BE MADE IN DOSIMETRY FOR FIELD IN FIELD BEAMS FOR DOSE HOMOGENEITY.  I have requested : 3D Simulation which is medically necessary to give adequate dose to at risk tissues while sparing lungs and heart.  I have requested a DVH of the following structures: lungs, heart, *** lumpectomy cavity.    The patient will receive ***  Gy in *** fractions to the *** with *** fields.  This will be *** followed by a boost.  On date of service, in total, I spent *** minutes on this encounter. Patient was seen in person.  _____________________________________   Lonie Peak, MD  This document serves as a record of services personally performed by Lonie Peak, MD. It was created on her behalf by Neena Rhymes, a trained medical scribe. The creation of this record is based on the scribe's personal observations and the provider's statements to them. This document has been checked and  approved by the attending provider.

## 2023-03-23 ENCOUNTER — Ambulatory Visit
Admission: RE | Admit: 2023-03-23 | Discharge: 2023-03-23 | Disposition: A | Discharge status: Home / Self Care | Payer: Medicare HMO | Source: Ambulatory Visit | Attending: Radiation Oncology | Admitting: Radiation Oncology

## 2023-03-23 ENCOUNTER — Other Ambulatory Visit: Payer: Self-pay

## 2023-03-23 ENCOUNTER — Encounter: Payer: Self-pay | Admitting: Radiation Oncology

## 2023-03-23 VITALS — BP 106/67 | HR 65 | Temp 96.8°F | Resp 18 | Ht <= 58 in | Wt 117.2 lb

## 2023-03-23 DIAGNOSIS — Z170 Estrogen receptor positive status [ER+]: Secondary | ICD-10-CM | POA: Diagnosis not present

## 2023-03-23 DIAGNOSIS — Z79899 Other long term (current) drug therapy: Secondary | ICD-10-CM | POA: Insufficient documentation

## 2023-03-23 DIAGNOSIS — C50212 Malignant neoplasm of upper-inner quadrant of left female breast: Secondary | ICD-10-CM | POA: Insufficient documentation

## 2023-03-23 DIAGNOSIS — Z510 Encounter for antineoplastic radiation therapy: Secondary | ICD-10-CM | POA: Insufficient documentation

## 2023-03-27 ENCOUNTER — Telehealth: Payer: Self-pay | Admitting: Hematology and Oncology

## 2023-03-27 NOTE — Telephone Encounter (Signed)
Scheduled appointments per los. Left voicemail for patient. 

## 2023-04-05 ENCOUNTER — Encounter: Payer: Self-pay | Admitting: *Deleted

## 2023-04-05 DIAGNOSIS — C50212 Malignant neoplasm of upper-inner quadrant of left female breast: Secondary | ICD-10-CM

## 2023-04-09 ENCOUNTER — Telehealth: Payer: Self-pay | Admitting: Hematology and Oncology

## 2023-04-09 DIAGNOSIS — Z510 Encounter for antineoplastic radiation therapy: Secondary | ICD-10-CM | POA: Insufficient documentation

## 2023-04-09 DIAGNOSIS — C50212 Malignant neoplasm of upper-inner quadrant of left female breast: Secondary | ICD-10-CM | POA: Insufficient documentation

## 2023-04-09 DIAGNOSIS — Z170 Estrogen receptor positive status [ER+]: Secondary | ICD-10-CM | POA: Diagnosis not present

## 2023-04-09 NOTE — Telephone Encounter (Signed)
Left a message regarding next follow up appointment with times/dates

## 2023-04-14 ENCOUNTER — Other Ambulatory Visit: Payer: Self-pay

## 2023-04-14 ENCOUNTER — Ambulatory Visit
Admission: RE | Admit: 2023-04-14 | Discharge: 2023-04-14 | Disposition: A | Discharge status: Home / Self Care | Payer: Medicare HMO | Source: Ambulatory Visit | Attending: Radiation Oncology | Admitting: Radiation Oncology

## 2023-04-14 ENCOUNTER — Telehealth: Payer: Self-pay

## 2023-04-14 DIAGNOSIS — Z170 Estrogen receptor positive status [ER+]: Secondary | ICD-10-CM | POA: Diagnosis not present

## 2023-04-14 DIAGNOSIS — Z510 Encounter for antineoplastic radiation therapy: Secondary | ICD-10-CM | POA: Diagnosis not present

## 2023-04-14 DIAGNOSIS — C50212 Malignant neoplasm of upper-inner quadrant of left female breast: Secondary | ICD-10-CM | POA: Diagnosis not present

## 2023-04-14 LAB — RAD ONC ARIA SESSION SUMMARY
Course Elapsed Days: 0
Plan Fractions Treated to Date: 1
Plan Prescribed Dose Per Fraction: 2.67 Gy
Plan Total Fractions Prescribed: 15
Plan Total Prescribed Dose: 40.05 Gy
Reference Point Dosage Given to Date: 2.67 Gy
Reference Point Session Dosage Given: 2.67 Gy
Session Number: 1

## 2023-04-14 NOTE — Telephone Encounter (Signed)
Returned husband's call regarding genetic testing. Husband states that Pt would like to proceed with genetic testing. Amb referral to genetics placed. Inbasket message sent to [employer]. Fredric Mare reports schedulers will be in touch with Pt for an appt. Husband verbalized understanding.

## 2023-04-15 ENCOUNTER — Other Ambulatory Visit: Payer: Self-pay

## 2023-04-15 ENCOUNTER — Ambulatory Visit
Admission: RE | Admit: 2023-04-15 | Discharge: 2023-04-15 | Disposition: A | Discharge status: Home / Self Care | Payer: Medicare HMO | Source: Ambulatory Visit | Attending: Radiation Oncology | Admitting: Radiation Oncology

## 2023-04-15 DIAGNOSIS — Z170 Estrogen receptor positive status [ER+]: Secondary | ICD-10-CM | POA: Diagnosis not present

## 2023-04-15 DIAGNOSIS — Z510 Encounter for antineoplastic radiation therapy: Secondary | ICD-10-CM | POA: Diagnosis not present

## 2023-04-15 DIAGNOSIS — C50212 Malignant neoplasm of upper-inner quadrant of left female breast: Secondary | ICD-10-CM | POA: Diagnosis not present

## 2023-04-15 LAB — RAD ONC ARIA SESSION SUMMARY
Course Elapsed Days: 1
Plan Fractions Treated to Date: 2
Plan Prescribed Dose Per Fraction: 2.67 Gy
Plan Total Fractions Prescribed: 15
Plan Total Prescribed Dose: 40.05 Gy
Reference Point Dosage Given to Date: 5.34 Gy
Reference Point Session Dosage Given: 2.67 Gy
Session Number: 2

## 2023-04-16 ENCOUNTER — Other Ambulatory Visit: Payer: Self-pay

## 2023-04-16 ENCOUNTER — Ambulatory Visit
Admission: RE | Admit: 2023-04-16 | Discharge: 2023-04-16 | Disposition: A | Discharge status: Home / Self Care | Payer: Medicare HMO | Source: Ambulatory Visit | Attending: Radiation Oncology | Admitting: Radiation Oncology

## 2023-04-16 DIAGNOSIS — Z510 Encounter for antineoplastic radiation therapy: Secondary | ICD-10-CM | POA: Diagnosis not present

## 2023-04-16 DIAGNOSIS — Z170 Estrogen receptor positive status [ER+]: Secondary | ICD-10-CM | POA: Diagnosis not present

## 2023-04-16 DIAGNOSIS — C50212 Malignant neoplasm of upper-inner quadrant of left female breast: Secondary | ICD-10-CM | POA: Diagnosis not present

## 2023-04-16 LAB — RAD ONC ARIA SESSION SUMMARY
Course Elapsed Days: 2
Plan Fractions Treated to Date: 3
Plan Prescribed Dose Per Fraction: 2.67 Gy
Plan Total Fractions Prescribed: 15
Plan Total Prescribed Dose: 40.05 Gy
Reference Point Dosage Given to Date: 8.01 Gy
Reference Point Session Dosage Given: 2.67 Gy
Session Number: 3

## 2023-04-19 ENCOUNTER — Other Ambulatory Visit: Payer: Self-pay

## 2023-04-19 ENCOUNTER — Ambulatory Visit
Admission: RE | Admit: 2023-04-19 | Discharge: 2023-04-19 | Disposition: A | Discharge status: Home / Self Care | Payer: Medicare HMO | Source: Ambulatory Visit | Attending: Radiation Oncology | Admitting: Radiation Oncology

## 2023-04-19 DIAGNOSIS — Z510 Encounter for antineoplastic radiation therapy: Secondary | ICD-10-CM | POA: Diagnosis not present

## 2023-04-19 DIAGNOSIS — C50212 Malignant neoplasm of upper-inner quadrant of left female breast: Secondary | ICD-10-CM

## 2023-04-19 DIAGNOSIS — Z170 Estrogen receptor positive status [ER+]: Secondary | ICD-10-CM | POA: Diagnosis not present

## 2023-04-19 LAB — RAD ONC ARIA SESSION SUMMARY
Course Elapsed Days: 5
Plan Fractions Treated to Date: 4
Plan Prescribed Dose Per Fraction: 2.67 Gy
Plan Total Fractions Prescribed: 15
Plan Total Prescribed Dose: 40.05 Gy
Reference Point Dosage Given to Date: 10.68 Gy
Reference Point Session Dosage Given: 2.67 Gy
Session Number: 4

## 2023-04-19 MED ORDER — ALRA NON-METALLIC DEODORANT (RAD-ONC)
1.0000 | Freq: Once | TOPICAL | Status: AC
  Administered 2023-04-19: 1 via TOPICAL

## 2023-04-19 MED ORDER — RADIAPLEXRX EX GEL: Freq: Once | CUTANEOUS | Status: AC

## 2023-04-19 NOTE — Progress Notes (Signed)
Pt here for patient teaching.  Pt given Radiation and You booklet, skin care instructions, Alra deodorant, and Radiaplex gel. Reviewed areas of pertinence such as fatigue, hair loss, skin changes, breast tenderness, and breast swelling . Pt able to give teach back of to pat skin, use unscented/gentle soap, and drink plenty of water,apply Radiaplex bid, avoid applying anything to skin within 4 hours of treatment, avoid wearing an under wire bra, and to use an electric razor if they must shave. Pt demonstrated understanding of information given and will contact nursing with any questions or concerns.     [email or web address]

## 2023-04-20 ENCOUNTER — Ambulatory Visit
Admission: RE | Admit: 2023-04-20 | Discharge: 2023-04-20 | Disposition: A | Discharge status: Home / Self Care | Payer: Medicare HMO | Source: Ambulatory Visit | Attending: Radiation Oncology | Admitting: Radiation Oncology

## 2023-04-20 ENCOUNTER — Other Ambulatory Visit: Payer: Self-pay

## 2023-04-20 DIAGNOSIS — C50212 Malignant neoplasm of upper-inner quadrant of left female breast: Secondary | ICD-10-CM | POA: Diagnosis not present

## 2023-04-20 DIAGNOSIS — Z510 Encounter for antineoplastic radiation therapy: Secondary | ICD-10-CM | POA: Diagnosis not present

## 2023-04-20 DIAGNOSIS — Z170 Estrogen receptor positive status [ER+]: Secondary | ICD-10-CM | POA: Diagnosis not present

## 2023-04-20 LAB — RAD ONC ARIA SESSION SUMMARY
Course Elapsed Days: 6
Plan Fractions Treated to Date: 5
Plan Prescribed Dose Per Fraction: 2.67 Gy
Plan Total Fractions Prescribed: 15
Plan Total Prescribed Dose: 40.05 Gy
Reference Point Dosage Given to Date: 13.35 Gy
Reference Point Session Dosage Given: 2.67 Gy
Session Number: 5

## 2023-04-21 ENCOUNTER — Other Ambulatory Visit: Payer: Self-pay

## 2023-04-21 ENCOUNTER — Ambulatory Visit
Admission: RE | Admit: 2023-04-21 | Discharge: 2023-04-21 | Disposition: A | Discharge status: Home / Self Care | Payer: Medicare HMO | Source: Ambulatory Visit | Attending: Radiation Oncology | Admitting: Radiation Oncology

## 2023-04-21 DIAGNOSIS — Z510 Encounter for antineoplastic radiation therapy: Secondary | ICD-10-CM | POA: Diagnosis not present

## 2023-04-21 DIAGNOSIS — Z170 Estrogen receptor positive status [ER+]: Secondary | ICD-10-CM | POA: Diagnosis not present

## 2023-04-21 DIAGNOSIS — C50212 Malignant neoplasm of upper-inner quadrant of left female breast: Secondary | ICD-10-CM | POA: Diagnosis not present

## 2023-04-21 LAB — RAD ONC ARIA SESSION SUMMARY
Course Elapsed Days: 7
Plan Fractions Treated to Date: 6
Plan Prescribed Dose Per Fraction: 2.67 Gy
Plan Total Fractions Prescribed: 15
Plan Total Prescribed Dose: 40.05 Gy
Reference Point Dosage Given to Date: 16.02 Gy
Reference Point Session Dosage Given: 2.67 Gy
Session Number: 6

## 2023-04-22 ENCOUNTER — Other Ambulatory Visit: Payer: Self-pay

## 2023-04-22 ENCOUNTER — Ambulatory Visit
Admission: RE | Admit: 2023-04-22 | Discharge: 2023-04-22 | Disposition: A | Discharge status: Home / Self Care | Payer: Medicare HMO | Source: Ambulatory Visit | Attending: Radiation Oncology | Admitting: Radiation Oncology

## 2023-04-22 DIAGNOSIS — Z510 Encounter for antineoplastic radiation therapy: Secondary | ICD-10-CM | POA: Diagnosis not present

## 2023-04-22 DIAGNOSIS — C50212 Malignant neoplasm of upper-inner quadrant of left female breast: Secondary | ICD-10-CM | POA: Diagnosis not present

## 2023-04-22 DIAGNOSIS — Z170 Estrogen receptor positive status [ER+]: Secondary | ICD-10-CM | POA: Diagnosis not present

## 2023-04-22 LAB — RAD ONC ARIA SESSION SUMMARY
Course Elapsed Days: 8
Plan Fractions Treated to Date: 7
Plan Prescribed Dose Per Fraction: 2.67 Gy
Plan Total Fractions Prescribed: 15
Plan Total Prescribed Dose: 40.05 Gy
Reference Point Dosage Given to Date: 18.69 Gy
Reference Point Session Dosage Given: 2.67 Gy
Session Number: 7

## 2023-04-23 ENCOUNTER — Other Ambulatory Visit: Payer: Self-pay

## 2023-04-23 ENCOUNTER — Ambulatory Visit
Admission: RE | Admit: 2023-04-23 | Discharge: 2023-04-23 | Disposition: A | Discharge status: Home / Self Care | Payer: Medicare HMO | Source: Ambulatory Visit | Attending: Radiation Oncology | Admitting: Radiation Oncology

## 2023-04-23 DIAGNOSIS — Z510 Encounter for antineoplastic radiation therapy: Secondary | ICD-10-CM | POA: Diagnosis not present

## 2023-04-23 DIAGNOSIS — C50212 Malignant neoplasm of upper-inner quadrant of left female breast: Secondary | ICD-10-CM | POA: Diagnosis not present

## 2023-04-23 DIAGNOSIS — Z170 Estrogen receptor positive status [ER+]: Secondary | ICD-10-CM | POA: Diagnosis not present

## 2023-04-23 LAB — RAD ONC ARIA SESSION SUMMARY
Course Elapsed Days: 9
Plan Fractions Treated to Date: 8
Plan Prescribed Dose Per Fraction: 2.67 Gy
Plan Total Fractions Prescribed: 15
Plan Total Prescribed Dose: 40.05 Gy
Reference Point Dosage Given to Date: 21.36 Gy
Reference Point Session Dosage Given: 2.67 Gy
Session Number: 8

## 2023-04-26 ENCOUNTER — Ambulatory Visit
Admission: RE | Admit: 2023-04-26 | Discharge: 2023-04-26 | Disposition: A | Discharge status: Home / Self Care | Payer: Medicare HMO | Source: Ambulatory Visit | Attending: Radiation Oncology | Admitting: Radiation Oncology

## 2023-04-26 ENCOUNTER — Other Ambulatory Visit: Payer: Self-pay

## 2023-04-26 DIAGNOSIS — Z510 Encounter for antineoplastic radiation therapy: Secondary | ICD-10-CM | POA: Diagnosis not present

## 2023-04-26 DIAGNOSIS — C50212 Malignant neoplasm of upper-inner quadrant of left female breast: Secondary | ICD-10-CM | POA: Diagnosis not present

## 2023-04-26 DIAGNOSIS — Z170 Estrogen receptor positive status [ER+]: Secondary | ICD-10-CM | POA: Diagnosis not present

## 2023-04-26 LAB — RAD ONC ARIA SESSION SUMMARY
Course Elapsed Days: 12
Plan Fractions Treated to Date: 9
Plan Prescribed Dose Per Fraction: 2.67 Gy
Plan Total Fractions Prescribed: 15
Plan Total Prescribed Dose: 40.05 Gy
Reference Point Dosage Given to Date: 24.03 Gy
Reference Point Session Dosage Given: 2.67 Gy
Session Number: 9

## 2023-04-27 ENCOUNTER — Ambulatory Visit
Admission: RE | Admit: 2023-04-27 | Discharge: 2023-04-27 | Disposition: A | Discharge status: Home / Self Care | Payer: Medicare HMO | Source: Ambulatory Visit | Attending: Radiation Oncology | Admitting: Radiation Oncology

## 2023-04-27 ENCOUNTER — Other Ambulatory Visit: Payer: Self-pay

## 2023-04-27 DIAGNOSIS — Z170 Estrogen receptor positive status [ER+]: Secondary | ICD-10-CM | POA: Diagnosis not present

## 2023-04-27 DIAGNOSIS — C50212 Malignant neoplasm of upper-inner quadrant of left female breast: Secondary | ICD-10-CM | POA: Diagnosis not present

## 2023-04-27 DIAGNOSIS — Z510 Encounter for antineoplastic radiation therapy: Secondary | ICD-10-CM | POA: Diagnosis not present

## 2023-04-27 LAB — RAD ONC ARIA SESSION SUMMARY
Course Elapsed Days: 13
Plan Fractions Treated to Date: 10
Plan Prescribed Dose Per Fraction: 2.67 Gy
Plan Total Fractions Prescribed: 15
Plan Total Prescribed Dose: 40.05 Gy
Reference Point Dosage Given to Date: 26.7 Gy
Reference Point Session Dosage Given: 2.67 Gy
Session Number: 10

## 2023-04-28 ENCOUNTER — Inpatient Hospital Stay: Payer: Medicare HMO

## 2023-04-28 ENCOUNTER — Other Ambulatory Visit: Payer: Self-pay

## 2023-04-28 ENCOUNTER — Ambulatory Visit: Admission: RE | Admit: 2023-04-28 | Payer: Medicare HMO | Source: Ambulatory Visit

## 2023-04-28 ENCOUNTER — Other Ambulatory Visit: Payer: Self-pay | Admitting: Genetic Counselor

## 2023-04-28 ENCOUNTER — Inpatient Hospital Stay: Payer: Medicare HMO | Attending: Hematology and Oncology | Admitting: Genetic Counselor

## 2023-04-28 DIAGNOSIS — C50212 Malignant neoplasm of upper-inner quadrant of left female breast: Secondary | ICD-10-CM

## 2023-04-28 DIAGNOSIS — Z510 Encounter for antineoplastic radiation therapy: Secondary | ICD-10-CM | POA: Diagnosis not present

## 2023-04-28 DIAGNOSIS — Z170 Estrogen receptor positive status [ER+]: Secondary | ICD-10-CM | POA: Diagnosis not present

## 2023-04-28 LAB — RAD ONC ARIA SESSION SUMMARY
Course Elapsed Days: 14
Plan Fractions Treated to Date: 11
Plan Prescribed Dose Per Fraction: 2.67 Gy
Plan Total Fractions Prescribed: 15
Plan Total Prescribed Dose: 40.05 Gy
Reference Point Dosage Given to Date: 29.37 Gy
Reference Point Session Dosage Given: 2.67 Gy
Session Number: 11

## 2023-04-28 LAB — GENETIC SCREENING ORDER

## 2023-04-29 ENCOUNTER — Ambulatory Visit
Admission: RE | Admit: 2023-04-29 | Discharge: 2023-04-29 | Disposition: A | Discharge status: Home / Self Care | Payer: Medicare HMO | Source: Ambulatory Visit | Attending: Radiation Oncology | Admitting: Radiation Oncology

## 2023-04-29 ENCOUNTER — Other Ambulatory Visit: Payer: Self-pay

## 2023-04-29 DIAGNOSIS — Z170 Estrogen receptor positive status [ER+]: Secondary | ICD-10-CM | POA: Diagnosis not present

## 2023-04-29 DIAGNOSIS — C50212 Malignant neoplasm of upper-inner quadrant of left female breast: Secondary | ICD-10-CM | POA: Diagnosis not present

## 2023-04-29 DIAGNOSIS — Z510 Encounter for antineoplastic radiation therapy: Secondary | ICD-10-CM | POA: Diagnosis not present

## 2023-04-29 LAB — RAD ONC ARIA SESSION SUMMARY
Course Elapsed Days: 15
Plan Fractions Treated to Date: 12
Plan Prescribed Dose Per Fraction: 2.67 Gy
Plan Total Fractions Prescribed: 15
Plan Total Prescribed Dose: 40.05 Gy
Reference Point Dosage Given to Date: 32.04 Gy
Reference Point Session Dosage Given: 2.67 Gy
Session Number: 12

## 2023-04-29 NOTE — Progress Notes (Signed)
REFERRING PROVIDER: Serena Croissant, MD [address] [place],  [place] [address]  PRIMARY PROVIDER:  Gretel Acre, MD  PRIMARY REASON FOR VISIT:  1. Malignant neoplasm of upper-inner quadrant of left breast in female, estrogen receptor positive (HCC)      HISTORY OF PRESENT ILLNESS:   Caitlin Burch, a 67 y.o. female, was seen for a [place] cancer genetics consultation at the request of Dr. Pamelia Hoit due to a personal history of breast cancer.  Caitlin Burch presents to clinic today to discuss the possibility of a hereditary predisposition to cancer, genetic testing, and to further clarify her future cancer risks, as well as potential cancer risks for family members.   In April 2024, at the age of 49, Caitlin Burch was diagnosed with breast cancer. The treatment plan includes lumpectomy and radiation.     CANCER HISTORY:  Oncology History  Malignant neoplasm of upper-inner quadrant of left breast in female, estrogen receptor positive (HCC)  01/29/2023 Initial Diagnosis   Screening mammogram detected left breast mass at 9:30 position measuring 0.4 cm, ultrasound-guided biopsy: Grade 2 IDC ER 90%, PR 30%, HER2 positive 3+ by IHC, Ki-67 30%   02/10/2023 Cancer Staging   Staging form: Breast, AJCC 8th Edition - Clinical: Stage IA (cT1a, cN0, cM0, G2, ER+, PR+, HER2+) - Signed by Serena Croissant, MD on 02/10/2023 Histologic grading system: 3 grade system   02/26/2023 Surgery   Left lumpectomy: Grade 2 IDC 0.4 cm with DCIS, margins negative, LVI not identified, ER 90%, PR 30%, HER2 3+ positive, Ki-67 30%, 0/4 lymph nodes negative      RISK FACTORS:  Menarche was at age 31.  First live birth at age 73.  OCP use for approximately  <5  years.  Ovaries intact: yes.  Hysterectomy: no.  Menopausal status: postmenopausal.  HRT use: 0 years. Colonoscopy: yes; normal. Mammogram within the last year: yes. Number of breast biopsies: 2. Up to date with pelvic exams: yes. Any excessive  radiation exposure in the past: no  Past Medical History:  Diagnosis Date   Breast cancer (HCC)    Hypertension    MVP (mitral valve prolapse)    questionable    Past Surgical History:  Procedure Laterality Date   BREAST BIOPSY Left 01/29/2023   [place] LT BREAST BX W LOC DEV 1ST LESION IMG BX SPEC US GUIDE 01/29/2023 [facility]   BREAST BIOPSY  02/23/2023   MM LT RADIOACTIVE SEED LOC MAMMO GUIDE 02/23/2023 [facility]   BREAST LUMPECTOMY WITH RADIOACTIVE SEED AND SENTINEL LYMPH NODE BIOPSY Left 02/23/2023   Procedure: LEFT BREAST LUMPECTOMY WITH RADIOACTIVE SEED;  Surgeon: Almond Lint, MD;  Location: [facility];  Service: General;  Laterality: Left;  90 MIN ROOM 2   MASTECTOMY W/ SENTINEL NODE BIOPSY Left 02/23/2023   Procedure: SENTINEL LYMPH NODE BIOPSY;  Surgeon: Almond Lint, MD;  Location: [facility];  Service: General;  Laterality: Left;   no surgical hx      Social History   Socioeconomic History   Marital status: Married    Spouse name: Not on file   Number of children: Not on file   Years of education: Not on file   Highest education level: Not on file  Occupational History   Not on file  Tobacco Use   Smoking status: Never   Smokeless tobacco: Never  Vaping Use   Vaping status: Never Used  Substance and Sexual Activity   Alcohol use: Yes    Alcohol/week: 1.0 standard drink of  alcohol    Types: 1 Glasses of wine per week   Drug use: Never   Sexual activity: Not on file  Other Topics Concern   Not on file  Social History Narrative   Not on file   Social Determinants of Health   Financial Resource Strain: Not on file  Food Insecurity: No Food Insecurity (03/23/2023)   Hunger Vital Sign    Worried About Running Out of Food in the Last Year: Never true    Ran Out of Food in the Last Year: Never true  Transportation Needs: No Transportation Needs (03/23/2023)   PRAPARE - Administrator, Civil Service (Medical): No    Lack of Transportation  (Non-Medical): No  Physical Activity: Not on file  Stress: Not on file  Social Connections: Not on file     FAMILY HISTORY:  We obtained a detailed, 4-generation family history.  Significant diagnoses are listed below: Family History  Problem Relation Age of Onset   Liver cancer Mother 32   Breast cancer Neg Hx      The patient has one daughter who is cancer free.  Her only brother died during the [place] war.  Both parents are deceased.  The patient's father also died during the war.  There is no information on his family.  The patient's mother died of liver cancer.  She had 4 siblings who were not reported to have cancer.  There is no additional reports of cancer.  Caitlin Burch is unaware of previous family history of genetic testing for hereditary cancer risks. Patient's maternal ancestors are of [place] descent, and paternal ancestors are of [place] descent. There is no reported Ashkenazi Jewish ancestry. There is no known consanguinity.  GENETIC COUNSELING ASSESSMENT: Caitlin Burch is a 67 y.o. female with a personal history of breast cancer which is not suggestive of a hereditary cancer syndrome and predisposition to cancer given her age of onset and she is the only person in the family known to have breast cancer. We, therefore, discussed and recommended the following at today's visit.   DISCUSSION: We discussed that, in general, most cancer is not inherited in families, but instead is sporadic or familial. Sporadic cancers occur by chance and typically happen at older ages (>50 years) as this type of cancer is caused by genetic changes acquired during an individual's lifetime. Some families have more cancers than would be expected by chance; however, the ages or types of cancer are not consistent with a known genetic mutation or known genetic mutations have been ruled out. This type of familial cancer is thought to be due to a combination of multiple genetic, environmental,  hormonal, and lifestyle factors. While this combination of factors likely increases the risk of cancer, the exact source of this risk is not currently identifiable or testable.  We discussed that 5 - 10% of breast cancer is hereditary, with most cases associated with BRCA mutations.  There are other genes that can be associated with hereditary breast cancer syndromes.  These include ATM, CHEK2 and PALB2.  We discussed that testing is beneficial for several reasons including knowing how to follow individuals after completing their treatment, identifying whether potential treatment options such as PARP inhibitors would be beneficial, and understand if other family members could be at risk for cancer and allow them to undergo genetic testing.   We reviewed the characteristics, features and inheritance patterns of hereditary cancer syndromes. We also discussed genetic testing, including the appropriate family members  to test, the process of testing, insurance coverage and turn-around-time for results. We discussed the implications of a negative, positive, carrier and/or variant of uncertain significant result. Caitlin Burch  was offered a common hereditary cancer panel (47 genes) and an expanded pan-cancer panel (77 genes). Caitlin Burch was informed of the benefits and limitations of each panel, including that expanded pan-cancer panels contain genes that do not have clear management guidelines at this point in time.  We also discussed that as the number of genes included on a panel increases, the chances of variants of uncertain significance increases. Caitlin Burch decided to pursue genetic testing for the CancerNext-Expanded+RNAinsight gene panel.   We discussed that some people do not want to undergo genetic testing due to fear of genetic discrimination.  The Genetic Information Nondiscrimination Act (GINA) was signed into federal law in 2008. GINA prohibits health insurers and most employers from discriminating  against individuals based on genetic information (including the results of genetic tests and family history information). According to GINA, health insurance companies cannot consider genetic information to be a preexisting condition, nor can they use it to make decisions regarding coverage or rates. GINA also makes it illegal for most employers to use genetic information in making decisions about hiring, firing, promotion, or terms of employment. It is important to note that GINA does not offer protections for life insurance, disability insurance, or long-term care insurance. GINA does not apply to those in the [employer], those who work for companies with less than 15 employees, and new life insurance or long-term disability insurance policies.  Health status due to a cancer diagnosis is not protected under GINA. More information about GINA can be found by visiting [email or web address].    PLAN: After considering the risks, benefits, and limitations, Caitlin Burch provided informed consent to pursue genetic testing and the blood sample was sent to [facility] for analysis of the CancerNext-Expanded+RNAinsight. Results should be available within approximately 2-3 weeks' time, at which point they will be disclosed by telephone to Caitlin Burch, as will any additional recommendations warranted by these results. Caitlin Burch will receive a summary of her genetic counseling visit and a copy of her results once available. This information will also be available in Epic.   Lastly, we encouraged Caitlin Burch to remain in contact with cancer genetics annually so that we can continuously update the family history and inform her of any changes in cancer genetics and testing that may be of benefit for this family.   Caitlin Burch questions were answered to her satisfaction today. Our contact information was provided should additional questions or concerns arise. Thank you for the referral and allowing [place] to share in  the care of your patient.   Bao Bazen P. Lowell Guitar, MS, [facility] Licensed, Patent attorney [email or web address] phone: [phone]  The patient was seen for a total of 30 minutes in face-to-face genetic counseling.  The patient brought her husband. Drs. Meliton Rattan, and/or [place] were available for questions, if needed..    _______________________________________________________________________ For Office Staff:  Number of people involved in session: 2 Was an Intern/ student involved with case: no

## 2023-04-30 ENCOUNTER — Ambulatory Visit: Admission: RE | Admit: 2023-04-30 | Payer: Medicare HMO | Source: Ambulatory Visit

## 2023-04-30 ENCOUNTER — Other Ambulatory Visit: Payer: Self-pay

## 2023-04-30 DIAGNOSIS — Z170 Estrogen receptor positive status [ER+]: Secondary | ICD-10-CM | POA: Diagnosis not present

## 2023-04-30 DIAGNOSIS — Z510 Encounter for antineoplastic radiation therapy: Secondary | ICD-10-CM | POA: Diagnosis not present

## 2023-04-30 DIAGNOSIS — C50212 Malignant neoplasm of upper-inner quadrant of left female breast: Secondary | ICD-10-CM | POA: Diagnosis not present

## 2023-04-30 LAB — RAD ONC ARIA SESSION SUMMARY
Course Elapsed Days: 16
Plan Fractions Treated to Date: 13
Plan Prescribed Dose Per Fraction: 2.67 Gy
Plan Total Fractions Prescribed: 15
Plan Total Prescribed Dose: 40.05 Gy
Reference Point Dosage Given to Date: 34.71 Gy
Reference Point Session Dosage Given: 2.67 Gy
Session Number: 13

## 2023-05-03 ENCOUNTER — Other Ambulatory Visit: Payer: Self-pay

## 2023-05-03 ENCOUNTER — Ambulatory Visit
Admission: RE | Admit: 2023-05-03 | Discharge: 2023-05-03 | Disposition: A | Discharge status: Home / Self Care | Payer: Medicare HMO | Source: Ambulatory Visit | Attending: Radiation Oncology | Admitting: Radiation Oncology

## 2023-05-03 DIAGNOSIS — Z170 Estrogen receptor positive status [ER+]: Secondary | ICD-10-CM | POA: Diagnosis not present

## 2023-05-03 DIAGNOSIS — Z510 Encounter for antineoplastic radiation therapy: Secondary | ICD-10-CM | POA: Diagnosis not present

## 2023-05-03 DIAGNOSIS — C50212 Malignant neoplasm of upper-inner quadrant of left female breast: Secondary | ICD-10-CM | POA: Diagnosis not present

## 2023-05-03 LAB — RAD ONC ARIA SESSION SUMMARY
Course Elapsed Days: 19
Plan Fractions Treated to Date: 14
Plan Prescribed Dose Per Fraction: 2.67 Gy
Plan Total Fractions Prescribed: 15
Plan Total Prescribed Dose: 40.05 Gy
Reference Point Dosage Given to Date: 37.38 Gy
Reference Point Session Dosage Given: 2.67 Gy
Session Number: 14

## 2023-05-04 ENCOUNTER — Other Ambulatory Visit: Payer: Self-pay

## 2023-05-04 ENCOUNTER — Ambulatory Visit: Admission: RE | Admit: 2023-05-04 | Payer: Medicare HMO | Source: Ambulatory Visit

## 2023-05-04 DIAGNOSIS — C50212 Malignant neoplasm of upper-inner quadrant of left female breast: Secondary | ICD-10-CM | POA: Diagnosis not present

## 2023-05-04 DIAGNOSIS — Z510 Encounter for antineoplastic radiation therapy: Secondary | ICD-10-CM | POA: Diagnosis not present

## 2023-05-04 DIAGNOSIS — Z170 Estrogen receptor positive status [ER+]: Secondary | ICD-10-CM | POA: Diagnosis not present

## 2023-05-04 LAB — RAD ONC ARIA SESSION SUMMARY
Course Elapsed Days: 20
Plan Fractions Treated to Date: 15
Plan Prescribed Dose Per Fraction: 2.67 Gy
Plan Total Fractions Prescribed: 15
Plan Total Prescribed Dose: 40.05 Gy
Reference Point Dosage Given to Date: 40.05 Gy
Reference Point Session Dosage Given: 2.67 Gy
Session Number: 15

## 2023-05-05 ENCOUNTER — Other Ambulatory Visit: Payer: Self-pay

## 2023-05-05 ENCOUNTER — Ambulatory Visit
Admission: RE | Admit: 2023-05-05 | Discharge: 2023-05-05 | Disposition: A | Discharge status: Home / Self Care | Payer: Medicare HMO | Source: Ambulatory Visit | Attending: Radiation Oncology | Admitting: Radiation Oncology

## 2023-05-05 DIAGNOSIS — Z170 Estrogen receptor positive status [ER+]: Secondary | ICD-10-CM | POA: Diagnosis not present

## 2023-05-05 DIAGNOSIS — C50212 Malignant neoplasm of upper-inner quadrant of left female breast: Secondary | ICD-10-CM | POA: Diagnosis not present

## 2023-05-05 DIAGNOSIS — Z510 Encounter for antineoplastic radiation therapy: Secondary | ICD-10-CM | POA: Diagnosis not present

## 2023-05-05 LAB — RAD ONC ARIA SESSION SUMMARY
Course Elapsed Days: 21
Plan Fractions Treated to Date: 1
Plan Prescribed Dose Per Fraction: 2 Gy
Plan Total Fractions Prescribed: 5
Plan Total Prescribed Dose: 10 Gy
Reference Point Dosage Given to Date: 2 Gy
Reference Point Session Dosage Given: 2 Gy
Session Number: 16

## 2023-05-06 ENCOUNTER — Other Ambulatory Visit: Payer: Self-pay

## 2023-05-06 ENCOUNTER — Ambulatory Visit
Admission: RE | Admit: 2023-05-06 | Discharge: 2023-05-06 | Disposition: A | Discharge status: Home / Self Care | Payer: Medicare HMO | Source: Ambulatory Visit | Attending: Radiation Oncology | Admitting: Radiation Oncology

## 2023-05-06 DIAGNOSIS — Z170 Estrogen receptor positive status [ER+]: Secondary | ICD-10-CM | POA: Insufficient documentation

## 2023-05-06 DIAGNOSIS — Z510 Encounter for antineoplastic radiation therapy: Secondary | ICD-10-CM | POA: Diagnosis not present

## 2023-05-06 DIAGNOSIS — C50212 Malignant neoplasm of upper-inner quadrant of left female breast: Secondary | ICD-10-CM | POA: Diagnosis not present

## 2023-05-06 LAB — RAD ONC ARIA SESSION SUMMARY
Course Elapsed Days: 22
Plan Fractions Treated to Date: 2
Plan Prescribed Dose Per Fraction: 2 Gy
Plan Total Fractions Prescribed: 5
Plan Total Prescribed Dose: 10 Gy
Reference Point Dosage Given to Date: 4 Gy
Reference Point Session Dosage Given: 2 Gy
Session Number: 17

## 2023-05-07 ENCOUNTER — Ambulatory Visit: Admission: RE | Admit: 2023-05-07 | Payer: Medicare HMO | Source: Ambulatory Visit

## 2023-05-07 ENCOUNTER — Ambulatory Visit
Admission: RE | Admit: 2023-05-07 | Discharge: 2023-05-07 | Disposition: A | Discharge status: Home / Self Care | Payer: Medicare HMO | Source: Ambulatory Visit | Attending: Radiation Oncology | Admitting: Radiation Oncology

## 2023-05-07 ENCOUNTER — Other Ambulatory Visit: Payer: Self-pay

## 2023-05-07 DIAGNOSIS — C50212 Malignant neoplasm of upper-inner quadrant of left female breast: Secondary | ICD-10-CM | POA: Diagnosis not present

## 2023-05-07 DIAGNOSIS — Z510 Encounter for antineoplastic radiation therapy: Secondary | ICD-10-CM | POA: Diagnosis not present

## 2023-05-07 DIAGNOSIS — Z170 Estrogen receptor positive status [ER+]: Secondary | ICD-10-CM | POA: Diagnosis not present

## 2023-05-07 LAB — RAD ONC ARIA SESSION SUMMARY
Course Elapsed Days: 23
Plan Fractions Treated to Date: 3
Plan Prescribed Dose Per Fraction: 2 Gy
Plan Total Fractions Prescribed: 5
Plan Total Prescribed Dose: 10 Gy
Reference Point Dosage Given to Date: 6 Gy
Reference Point Session Dosage Given: 2 Gy
Session Number: 18

## 2023-05-10 ENCOUNTER — Other Ambulatory Visit: Payer: Self-pay

## 2023-05-10 ENCOUNTER — Ambulatory Visit: Admission: RE | Admit: 2023-05-10 | Payer: Medicare HMO | Source: Ambulatory Visit

## 2023-05-10 DIAGNOSIS — C50212 Malignant neoplasm of upper-inner quadrant of left female breast: Secondary | ICD-10-CM | POA: Diagnosis not present

## 2023-05-10 DIAGNOSIS — Z510 Encounter for antineoplastic radiation therapy: Secondary | ICD-10-CM | POA: Diagnosis not present

## 2023-05-10 DIAGNOSIS — Z170 Estrogen receptor positive status [ER+]: Secondary | ICD-10-CM | POA: Diagnosis not present

## 2023-05-10 LAB — RAD ONC ARIA SESSION SUMMARY
Course Elapsed Days: 26
Plan Fractions Treated to Date: 4
Plan Prescribed Dose Per Fraction: 2 Gy
Plan Total Fractions Prescribed: 5
Plan Total Prescribed Dose: 10 Gy
Reference Point Dosage Given to Date: 8 Gy
Reference Point Session Dosage Given: 2 Gy
Session Number: 19

## 2023-05-11 ENCOUNTER — Ambulatory Visit
Admission: RE | Admit: 2023-05-11 | Discharge: 2023-05-11 | Disposition: A | Discharge status: Home / Self Care | Payer: Medicare HMO | Source: Ambulatory Visit | Attending: Radiation Oncology | Admitting: Radiation Oncology

## 2023-05-11 ENCOUNTER — Other Ambulatory Visit: Payer: Self-pay

## 2023-05-11 DIAGNOSIS — Z170 Estrogen receptor positive status [ER+]: Secondary | ICD-10-CM | POA: Diagnosis not present

## 2023-05-11 DIAGNOSIS — C50212 Malignant neoplasm of upper-inner quadrant of left female breast: Secondary | ICD-10-CM | POA: Diagnosis not present

## 2023-05-11 DIAGNOSIS — Z510 Encounter for antineoplastic radiation therapy: Secondary | ICD-10-CM | POA: Diagnosis not present

## 2023-05-11 LAB — RAD ONC ARIA SESSION SUMMARY
Course Elapsed Days: 27
Plan Fractions Treated to Date: 5
Plan Prescribed Dose Per Fraction: 2 Gy
Plan Total Fractions Prescribed: 5
Plan Total Prescribed Dose: 10 Gy
Reference Point Dosage Given to Date: 10 Gy
Reference Point Session Dosage Given: 2 Gy
Session Number: 20

## 2023-05-11 NOTE — Progress Notes (Signed)
Patient Care Team: Gretel Acre, MD as PCP - General (Family Medicine) Pershing Proud, RN as Oncology Nurse Navigator Donnelly Angelica, RN as Oncology Nurse Navigator Almond Lint, MD as Consulting Physician (General Surgery) Serena Croissant, MD as Consulting Physician (Hematology and Oncology) Lonie Peak, MD as Attending Physician (Radiation Oncology)  DIAGNOSIS: No diagnosis found.  SUMMARY OF ONCOLOGIC HISTORY: Oncology History  Malignant neoplasm of upper-inner quadrant of left breast in female, estrogen receptor positive (HCC)  01/29/2023 Initial Diagnosis   Screening mammogram detected left breast mass at 9:30 position measuring 0.4 cm, ultrasound-guided biopsy: Grade 2 IDC ER 90%, PR 30%, HER2 positive 3+ by IHC, Ki-67 30%   02/10/2023 Cancer Staging   Staging form: Breast, AJCC 8th Edition - Clinical: Stage IA (cT1a, cN0, cM0, G2, ER+, PR+, HER2+) - Signed by Serena Croissant, MD on 02/10/2023 Histologic grading system: 3 grade system   02/26/2023 Surgery   Left lumpectomy: Grade 2 IDC 0.4 cm with DCIS, margins negative, LVI not identified, ER 90%, PR 30%, HER2 3+ positive, Ki-67 30%, 0/4 lymph nodes negative     CHIEF COMPLIANT: Follow-up after radiation  INTERVAL HISTORY: Caitlin Burch is a  67 y.o. female is here because of recent diagnosis of left breast cancer. She presents to the clinic for a follow-up.    ALLERGIES:  has No Known Allergies.  MEDICATIONS:  Current Outpatient Medications  Medication Sig Dispense Refill   alendronate (FOSAMAX) 70 MG tablet Take 70 mg by mouth once a week. Take with a full glass of water on an empty stomach.     atorvastatin (LIPITOR) 10 MG tablet Take 10 mg by mouth daily.     CALCIUM PO Take 1 tablet by mouth daily.     Cholecalciferol (VITAMIN D3) 1000 units CAPS Take 1,000 Units by mouth daily.     Cyanocobalamin (B-12 PO) Take 1 tablet by mouth daily.     Ferrous Sulfate (IRON) 325 (65 Fe) MG TABS Take 325 mg by mouth daily.      lisinopril (PRINIVIL,ZESTRIL) 20 MG tablet TAKE 1 TABLET (20 MG TOTAL) BY MOUTH DAILY. 90 tablet 0   metoprolol succinate (TOPROL-XL) 25 MG 24 hr tablet TAKE 1 TABLET (25 MG TOTAL) BY MOUTH DAILY. 90 tablet 0   Multiple Vitamin (MULTIVITAMIN) capsule Take 1 capsule by mouth daily.     oxyCODONE (OXY IR/ROXICODONE) 5 MG immediate release tablet Take 1 tablet (5 mg total) by mouth every 6 (six) hours as needed for severe pain. 10 tablet 0   No current facility-administered medications for this visit.    PHYSICAL EXAMINATION: ECOG PERFORMANCE STATUS: {CHL ONC ECOG [phone]}  There were no vitals filed for this visit. There were no vitals filed for this visit.  BREAST:*** No palpable masses or nodules in either right or left breasts. No palpable axillary supraclavicular or infraclavicular adenopathy no breast tenderness or nipple discharge. (exam performed in the presence of a chaperone)  LABORATORY DATA:  I have reviewed the data as listed    Latest Ref Rng & Units 02/10/2023   12:12 PM  CMP  Glucose 70 - 99 mg/dL 161   BUN 8 - 23 mg/dL 12   Creatinine 0.96 - 1.00 mg/dL 0.45   Sodium 409 - 811 mmol/L 142   Potassium 3.5 - 5.1 mmol/L 4.0   Chloride 98 - 111 mmol/L 104   CO2 22 - 32 mmol/L 33   Calcium 8.9 - 10.3 mg/dL 91.4   Total Protein 6.5 -  8.1 g/dL 7.8   Total Bilirubin 0.3 - 1.2 mg/dL 0.5   Alkaline Phos 38 - 126 U/L 77   AST 15 - 41 U/L 16   ALT 0 - 44 U/L 14     Lab Results  Component Value Date   WBC 8.0 02/10/2023   HGB 13.5 02/10/2023   HCT 39.6 02/10/2023   MCV 94.1 02/10/2023   PLT 237 02/10/2023   NEUTROABS 5.4 02/10/2023    ASSESSMENT & PLAN:  No problem-specific Assessment & Plan notes found for this encounter.    No orders of the defined types were placed in this encounter.  The patient has a good understanding of the overall plan. she agrees with it. she will call with any problems that may develop before the next visit here. Total time  spent: 30 mins including face to face time and time spent for planning, charting and co-ordination of care   Sherlyn Lick, CMA 05/11/23    I Janan Ridge am acting as a Neurosurgeon for [employer]  ***

## 2023-05-12 ENCOUNTER — Other Ambulatory Visit: Payer: Self-pay

## 2023-05-12 ENCOUNTER — Inpatient Hospital Stay: Payer: Medicare HMO | Attending: Hematology and Oncology | Admitting: Hematology and Oncology

## 2023-05-12 VITALS — BP 108/68 | HR 80 | Temp 97.5°F | Resp 18 | Ht <= 58 in | Wt 118.3 lb

## 2023-05-12 DIAGNOSIS — C50212 Malignant neoplasm of upper-inner quadrant of left female breast: Secondary | ICD-10-CM | POA: Diagnosis not present

## 2023-05-12 DIAGNOSIS — Z79899 Other long term (current) drug therapy: Secondary | ICD-10-CM | POA: Diagnosis not present

## 2023-05-12 DIAGNOSIS — G4700 Insomnia, unspecified: Secondary | ICD-10-CM | POA: Diagnosis not present

## 2023-05-12 DIAGNOSIS — Z170 Estrogen receptor positive status [ER+]: Secondary | ICD-10-CM | POA: Diagnosis not present

## 2023-05-12 DIAGNOSIS — Z923 Personal history of irradiation: Secondary | ICD-10-CM | POA: Insufficient documentation

## 2023-05-12 DIAGNOSIS — R232 Flushing: Secondary | ICD-10-CM | POA: Diagnosis not present

## 2023-05-12 DIAGNOSIS — M810 Age-related osteoporosis without current pathological fracture: Secondary | ICD-10-CM | POA: Diagnosis not present

## 2023-05-12 DIAGNOSIS — Z79811 Long term (current) use of aromatase inhibitors: Secondary | ICD-10-CM | POA: Insufficient documentation

## 2023-05-12 MED ORDER — ANASTROZOLE 1 MG PO TABS: 1.0000 mg | ORAL_TABLET | Freq: Every day | ORAL | 3 refills | Status: DC

## 2023-05-12 NOTE — Assessment & Plan Note (Addendum)
02/26/2023:Left lumpectomy: Grade 2 IDC 0.4 cm with DCIS, margins negative, LVI not identified, ER 90%, PR 30%, HER2 3+ positive, Ki-67 30%, 0/4 lymph nodes negative  Since the final tumor size is less than 0.5 cm there is no role of adjuvant anti-HER2 based chemotherapy.  04/15/2023-05/11/2023: Adjuvant radiation  Current treatment: Adjuvant antiestrogen therapy with anastrozole 1 mg daily x 5 years  Anastrozole counseling: We discussed the risks and benefits of anti-estrogen therapy with aromatase inhibitors. These include but not limited to insomnia, hot flashes, mood changes, vaginal dryness, bone density loss, and weight gain. We strongly believe that the benefits far outweigh the risks. Patient understands these risks and consented to starting treatment. Planned treatment duration is 5 years.  Osteoporosis: Currently on Fosamax.  Her last bone density showed a T-score of -3.4.  I recommend switching her to Prolia.  We will set her up to get Prolia towards the end of August.  She will get blood work with her primary care physician so we do not need to draw additional labs at that time.   Return to clinic in 3 months for survivorship care plan visit

## 2023-05-12 NOTE — Radiation Completion Notes (Signed)
Patient Name: Caitlin Burch, SMOLENSKI MRN: [id number] Date of Birth: 1956-05-08 Referring Physician: Gretel Acre, M.D. Date of Service: 2023-05-12 Radiation Oncologist: Lonie Peak, M.D. [facility]                             RADIATION ONCOLOGY END OF TREATMENT NOTE     Diagnosis: C50.212 Malignant neoplasm of upper-inner quadrant of left female breast Staging on 2023-02-10: Malignant neoplasm of upper-inner quadrant of left breast in female, estrogen receptor positive (HCC) T=cT1a, N=cN0, M=cM0 Intent: Curative     ==========DELIVERED PLANS==========  First Treatment Date: 2023-04-14 - Last Treatment Date: 2023-05-11   Plan Name: Breast_L Site: Breast, Left Technique: 3D Mode: Photon Dose Per Fraction: 2.67 Gy Prescribed Dose (Delivered / Prescribed): 40.05 Gy / 40.05 Gy Prescribed Fxs (Delivered / Prescribed): 15 / 15   Plan Name: Breast_L_Bst Site: Breast, Left Technique: 3D Mode: Photon Dose Per Fraction: 2 Gy Prescribed Dose (Delivered / Prescribed): 10 Gy / 10 Gy Prescribed Fxs (Delivered / Prescribed): 5 / 5     ==========ON TREATMENT VISIT DATES========== 2023-04-19, 2023-04-26, 2023-05-03, 2023-05-07     ==========UPCOMING VISITS==========       ==========APPENDIX - ON TREATMENT VISIT NOTES==========   See weekly On Treatment Notes in Epic for details.

## 2023-05-19 ENCOUNTER — Telehealth: Payer: Self-pay | Admitting: Adult Health

## 2023-05-19 NOTE — Telephone Encounter (Signed)
 Left patient a message in regards to scheduled appointment times/dates

## 2023-05-24 NOTE — Therapy (Incomplete)
  OUTPATIENT PHYSICAL THERAPY SOZO SCREENING NOTE   Patient Name: Caitlin Burch MRN: [id number] DOB:[birth year], 67 y.o., female Today's Date: 05/24/2023  PCP: Gretel Acre, MD REFERRING PROVIDER: Almond Lint, MD    Past Medical History:  Diagnosis Date   Breast cancer [facility])    Hypertension    MVP (mitral valve prolapse)    questionable   Past Surgical History:  Procedure Laterality Date   BREAST BIOPSY Left 01/29/2023   [place] LT BREAST BX W LOC DEV 1ST LESION IMG BX SPEC US GUIDE 01/29/2023 [facility]   BREAST BIOPSY  02/23/2023   MM LT RADIOACTIVE SEED LOC MAMMO GUIDE 02/23/2023 [facility]   BREAST LUMPECTOMY WITH RADIOACTIVE SEED AND SENTINEL LYMPH NODE BIOPSY Left 02/23/2023   Procedure: LEFT BREAST LUMPECTOMY WITH RADIOACTIVE SEED;  Surgeon: Almond Lint, MD;  Location: [facility];  Service: General;  Laterality: Left;  90 MIN ROOM 2   MASTECTOMY W/ SENTINEL NODE BIOPSY Left 02/23/2023   Procedure: SENTINEL LYMPH NODE BIOPSY;  Surgeon: Almond Lint, MD;  Location: [facility];  Service: General;  Laterality: Left;   no surgical hx     Patient Active Problem List   Diagnosis Date Noted   Osteoporosis 05/12/2023   Malignant neoplasm of upper-inner quadrant of left breast in female, estrogen receptor positive (HCC) 02/08/2023   Chronic systolic CHF (congestive heart failure) (HCC) 09/21/2014   Chronic renal failure, stage 4 (severe) (HCC) 09/21/2014    REFERRING DIAG: left breast cancer at risk for lymphedema  THERAPY DIAG:  No diagnosis found.  PERTINENT HISTORY:  Left lumpectomy on 02/23/23 with 4 negative nodes removed.  Patient was diagnosed on 01/11/2023 with left grade 2 invasive ductal carcinoma breast cancer. It measures 4 mm and is located in the upper inner quadrant. It is triple positive with a Ki67 of 30%   PRECAUTIONS: left UE Lymphedema risk,   SUBJECTIVE: ***  PAIN:  Are you having pain? {OPRCPAIN:[id number]}  SOZO SCREENING: Patient was assessed today  using the SOZO machine to determine the lymphedema index score. This was compared to her baseline score. It was determined that she is within the recommended range when compared to her baseline and no further action is needed at this time. She will continue SOZO screenings. These are done every 3 months for 2 years post operatively followed by every 6 months for 2 years, and then annually.  Patient was assessed today using the SOZO machine to determine the lymphedema index score. This was compared to her baseline score. It was determined that she is NOT within the recommended range when compared to her baseline and so she was fitted for a compression garment while in the clinic today. It is recommended she return in 1 month to be reassessed. If she continues to measure outside the recommended range, physical therapy treatment will be recommended at that time and a referral requested.    Waynette Buttery, PT 05/24/2023, 8:29 PM

## 2023-05-25 ENCOUNTER — Ambulatory Visit: Payer: Medicare HMO | Attending: General Surgery | Admitting: Rehabilitation

## 2023-05-25 ENCOUNTER — Other Ambulatory Visit: Payer: Self-pay

## 2023-05-25 ENCOUNTER — Encounter: Payer: Self-pay | Admitting: Rehabilitation

## 2023-05-25 DIAGNOSIS — Z9189 Other specified personal risk factors, not elsewhere classified: Secondary | ICD-10-CM | POA: Insufficient documentation

## 2023-05-25 DIAGNOSIS — R293 Abnormal posture: Secondary | ICD-10-CM | POA: Insufficient documentation

## 2023-05-25 DIAGNOSIS — Z170 Estrogen receptor positive status [ER+]: Secondary | ICD-10-CM | POA: Insufficient documentation

## 2023-05-25 DIAGNOSIS — M810 Age-related osteoporosis without current pathological fracture: Secondary | ICD-10-CM

## 2023-05-25 DIAGNOSIS — Z483 Aftercare following surgery for neoplasm: Secondary | ICD-10-CM | POA: Insufficient documentation

## 2023-05-25 DIAGNOSIS — C50212 Malignant neoplasm of upper-inner quadrant of left female breast: Secondary | ICD-10-CM | POA: Insufficient documentation

## 2023-05-25 NOTE — Therapy (Signed)
  OUTPATIENT PHYSICAL THERAPY SOZO SCREENING NOTE   Patient Name: Caitlin Burch MRN: [id number] DOB:Jun 01, 1956, 67 y.o., female Today's Date: 05/25/2023  PCP: Gretel Acre, MD REFERRING PROVIDER: Almond Lint, MD   PT End of Session - 05/25/23 1117     Visit Number 2   screen   PT Start Time 1100    PT Stop Time 1105    PT Time Calculation (min) 5 min    Activity Tolerance Patient tolerated treatment well    Behavior During Therapy WFL for tasks assessed/performed             Past Medical History:  Diagnosis Date   Breast cancer (HCC)    Hypertension    MVP (mitral valve prolapse)    questionable   Past Surgical History:  Procedure Laterality Date   BREAST BIOPSY Left 01/29/2023   [place] LT BREAST BX W LOC DEV 1ST LESION IMG BX SPEC US GUIDE 01/29/2023 [facility]   BREAST BIOPSY  02/23/2023   MM LT RADIOACTIVE SEED LOC MAMMO GUIDE 02/23/2023 [facility]   BREAST LUMPECTOMY WITH RADIOACTIVE SEED AND SENTINEL LYMPH NODE BIOPSY Left 02/23/2023   Procedure: LEFT BREAST LUMPECTOMY WITH RADIOACTIVE SEED;  Surgeon: Almond Lint, MD;  [facility];  Service: General;  Laterality: Left;  90 MIN ROOM 2   MASTECTOMY W/ SENTINEL NODE BIOPSY Left 02/23/2023   Procedure: SENTINEL LYMPH NODE BIOPSY;  Surgeon: Almond Lint, MD;  Location: [facility];  Service: General;  Laterality: Left;   no surgical hx     Patient Active Problem List   Diagnosis Date Noted   Osteoporosis 05/12/2023   Malignant neoplasm of upper-inner quadrant of left breast in female, estrogen receptor positive (HCC) 02/08/2023   Chronic systolic CHF (congestive heart failure) (HCC) 09/21/2014   Chronic renal failure, stage 4 (severe) (HCC) 09/21/2014    REFERRING DIAG: left breast cancer at risk for lymphedema  THERAPY DIAG:  Malignant neoplasm of upper-inner quadrant of left breast in female, estrogen receptor positive (HCC)  At risk for lymphedema  Aftercare following surgery for  neoplasm  Abnormal posture  PERTINENT HISTORY: Left lumpectomy on 02/23/23 with 4 negative nodes removed.  Patient was diagnosed on 01/11/2023 with left grade 2 invasive ductal carcinoma breast cancer. It measures 4 mm and is located in the upper inner quadrant. It is triple positive with a Ki67 of 30%.   PRECAUTIONS: left UE Lymphedema risk  SUBJECTIVE: treatment is now done and doing well   PAIN:  Are you having pain? No  SOZO SCREENING: Patient was assessed today using the SOZO machine to determine the lymphedema index score. This was compared to her baseline score. It was determined that she is within the recommended range when compared to her baseline and no further action is needed at this time. She will continue SOZO screenings. These are done every 3 months for 2 years post operatively followed by every 6 months for 2 years, and then annually.   Idamae Lusher, PT 05/25/2023, 11:20 AM

## 2023-05-26 ENCOUNTER — Inpatient Hospital Stay: Payer: Medicare HMO

## 2023-05-26 VITALS — BP 116/69 | HR 66 | Temp 98.5°F | Resp 16

## 2023-05-26 DIAGNOSIS — M810 Age-related osteoporosis without current pathological fracture: Secondary | ICD-10-CM

## 2023-05-26 DIAGNOSIS — R232 Flushing: Secondary | ICD-10-CM | POA: Diagnosis not present

## 2023-05-26 DIAGNOSIS — Z923 Personal history of irradiation: Secondary | ICD-10-CM | POA: Diagnosis not present

## 2023-05-26 DIAGNOSIS — C50212 Malignant neoplasm of upper-inner quadrant of left female breast: Secondary | ICD-10-CM | POA: Diagnosis not present

## 2023-05-26 DIAGNOSIS — Z170 Estrogen receptor positive status [ER+]: Secondary | ICD-10-CM | POA: Diagnosis not present

## 2023-05-26 DIAGNOSIS — Z79899 Other long term (current) drug therapy: Secondary | ICD-10-CM | POA: Diagnosis not present

## 2023-05-26 DIAGNOSIS — G4700 Insomnia, unspecified: Secondary | ICD-10-CM | POA: Diagnosis not present

## 2023-05-26 DIAGNOSIS — Z79811 Long term (current) use of aromatase inhibitors: Secondary | ICD-10-CM | POA: Diagnosis not present

## 2023-05-26 LAB — CBC WITH DIFFERENTIAL (CANCER CENTER ONLY)
Abs Immature Granulocytes: 0.01 10*3/uL (ref 0.00–0.07)
Basophils Absolute: 0 10*3/uL (ref 0.0–0.1)
Basophils Relative: 1 %
Eosinophils Absolute: 0.2 10*3/uL (ref 0.0–0.5)
Eosinophils Relative: 3 %
HCT: 39.6 % (ref 36.0–46.0)
Hemoglobin: 13.4 g/dL (ref 12.0–15.0)
Immature Granulocytes: 0 %
Lymphocytes Relative: 31 %
Lymphs Abs: 1.7 10*3/uL (ref 0.7–4.0)
MCH: 32.2 pg (ref 26.0–34.0)
MCHC: 33.8 g/dL (ref 30.0–36.0)
MCV: 95.2 fL (ref 80.0–100.0)
Monocytes Absolute: 0.4 10*3/uL (ref 0.1–1.0)
Monocytes Relative: 8 %
Neutro Abs: 3.3 10*3/uL (ref 1.7–7.7)
Neutrophils Relative %: 57 %
Platelet Count: 223 10*3/uL (ref 150–400)
RBC: 4.16 MIL/uL (ref 3.87–5.11)
RDW: 12.1 % (ref 11.5–15.5)
WBC Count: 5.7 10*3/uL (ref 4.0–10.5)
nRBC: 0 % (ref 0.0–0.2)

## 2023-05-26 LAB — CMP (CANCER CENTER ONLY)
ALT: 14 U/L (ref 0–44)
AST: 17 U/L (ref 15–41)
Albumin: 4.5 g/dL (ref 3.5–5.0)
Alkaline Phosphatase: 80 U/L (ref 38–126)
Anion gap: 4 — ABNORMAL LOW (ref 5–15)
BUN: 12 mg/dL (ref 8–23)
CO2: 30 mmol/L (ref 22–32)
Calcium: 9.8 mg/dL (ref 8.9–10.3)
Chloride: 105 mmol/L (ref 98–111)
Creatinine: 0.81 mg/dL (ref 0.44–1.00)
GFR, Estimated: >60 mL/min (ref >60)
Glucose, Bld: 100 mg/dL — ABNORMAL HIGH (ref 70–99)
Potassium: 4.2 mmol/L (ref 3.5–5.1)
Sodium: 139 mmol/L (ref 135–145)
Total Bilirubin: 0.8 mg/dL (ref 0.3–1.2)
Total Protein: 7.9 g/dL (ref 6.5–8.1)

## 2023-05-26 MED ORDER — DENOSUMAB 60 MG/ML ~~LOC~~ SOSY
60.0000 mg | PREFILLED_SYRINGE | Freq: Once | SUBCUTANEOUS | Status: AC
  Administered 2023-05-26: 60 mg via SUBCUTANEOUS
  Filled 2023-05-26: qty 1

## 2023-05-27 ENCOUNTER — Telehealth: Payer: Self-pay | Admitting: Genetic Counselor

## 2023-05-27 ENCOUNTER — Encounter: Payer: Self-pay | Admitting: Genetic Counselor

## 2023-05-27 DIAGNOSIS — Z1379 Encounter for other screening for genetic and chromosomal anomalies: Secondary | ICD-10-CM | POA: Insufficient documentation

## 2023-05-27 NOTE — Telephone Encounter (Signed)
I attempted to contact Caitlin Burch to discuss her genetic testing results (71 genes). I left a voicemail requesting she call me back at [phone].  Lalla Brothers, MS, [facility] Genetic Counselor [email or web address] (P) [phone]

## 2023-05-31 ENCOUNTER — Telehealth: Payer: Self-pay | Admitting: Genetic Counselor

## 2023-05-31 NOTE — Telephone Encounter (Signed)
Revealed negative genetic testing.  Discussed that we do not know why she has breast cancer or why there is cancer in the family. It could be due to a different gene that we are not testing, or maybe our current technology may not be able to pick something up.  It will be important for her to keep in contact with genetics to keep up with whether additional testing may be needed. 

## 2023-06-01 ENCOUNTER — Ambulatory Visit: Payer: Self-pay | Admitting: Genetic Counselor

## 2023-06-01 DIAGNOSIS — Z1379 Encounter for other screening for genetic and chromosomal anomalies: Secondary | ICD-10-CM

## 2023-06-01 NOTE — Progress Notes (Signed)
HPI:  [name] was previously seen in the [facility] due to a personal history of breast cancer and concerns regarding a hereditary predisposition to cancer. Please refer to our prior cancer genetics clinic note for more information regarding our discussion, assessment and recommendations, at the time. [name] recent genetic test results were disclosed to her, as were recommendations warranted by these results. These results and recommendations are discussed in more detail below.  CANCER HISTORY:  Oncology History  Malignant neoplasm of upper-inner quadrant of left breast in female, estrogen receptor positive (HCC)  01/29/2023 Initial Diagnosis   Screening mammogram detected left breast mass at 9:30 position measuring 0.4 cm, ultrasound-guided biopsy: Grade 2 IDC ER 90%, PR 30%, HER2 positive 3+ by IHC, Ki-67 30%   02/10/2023 Cancer Staging   Staging form: Breast, AJCC 8th Edition - Clinical: Stage IA (cT1a, cN0, cM0, G2, ER+, PR+, HER2+) - Signed by Caitlin Croissant, MD on 02/10/2023 Histologic grading system: 3 grade system   02/26/2023 Surgery   Left lumpectomy: Grade 2 IDC 0.4 cm with DCIS, margins negative, LVI not identified, ER 90%, PR 30%, HER2 3+ positive, Ki-67 30%, 0/4 lymph nodes negative   04/15/2023 - 05/11/2023 Radiation Therapy   Adjuvant radiation   05/12/2023 -  Anti-estrogen oral therapy   Anastrozole 1 mg daily    Genetic Testing   [facility] CancerNext-Expanded Panel+RNA was Negative. Report date is 05/26/2023.   The CancerNext-Expanded gene panel offered by [facility] and includes sequencing, rearrangement, and RNA analysis for the following 71 genes: AIP, ALK, APC, ATM, AXIN2, BAP1, BARD1, BMPR1A, BRCA1, BRCA2, BRIP1, CDC73, CDH1, CDK4, CDKN1B, CDKN2A, CHEK2, CTNNA1, DICER1, FH, FLCN, KIF1B, LZTR1, MAX, MEN1, MET, MLH1, MSH2, MSH3, MSH6, MUTYH, NF1, NF2, NTHL1, PALB2, PHOX2B, PMS2, POT1, PRKAR1A, PTCH1, PTEN, RAD51C, RAD51D, RB1, RET, SDHA, SDHAF2,  SDHB, SDHC, SDHD, SMAD4, SMARCA4, SMARCB1, SMARCE1, STK11, SUFU, TMEM127, TP53, TSC1, TSC2, and VHL (sequencing and deletion/duplication); EGFR, EGLN1, HOXB13, KIT, MITF, PDGFRA, POLD1, and POLE (sequencing only); EPCAM and GREM1 (deletion/duplication only).      FAMILY HISTORY:  We obtained a detailed, 4-generation family history.  Significant diagnoses are listed below: Family History  Problem Relation Age of Onset   Liver cancer Mother 57   Breast cancer Neg Hx        The patient has one daughter who is cancer free.  Her only brother died during the [place] war.  Both parents are deceased.   The patient's father also died during the war.  There is no information on his family.   The patient's mother died of liver cancer.  She had 4 siblings who were not reported to have cancer.  There is no additional reports of cancer.   [name] is unaware of previous family history of genetic testing for hereditary cancer risks. Patient's maternal ancestors are of [place] descent, and paternal ancestors are of [place] descent. There is no reported Ashkenazi Jewish ancestry. There is no known consanguinity  GENETIC TEST RESULTS: Genetic testing reported out on May 26, 2023 through the CancerNext-Expanded+RNAinsight cancer panel found no pathogenic mutations. The CancerNext-Expanded gene panel offered by [facility] and includes sequencing and rearrangement analysis for the following 77 genes: AIP, ALK, APC*, ATM*, AXIN2, BAP1, BARD1, BMPR1A, BRCA1*, BRCA2*, BRIP1*, CDC73, CDH1*, CDK4, CDKN1B, CDKN2A, CHEK2*, CTNNA1, DICER1, FH, FLCN, KIF1B, LZTR1, MAX, MEN1, MET, MLH1*, MSH2*, MSH3, MSH6*, MUTYH*, NF1*, NF2, NTHL1, PALB2*, PHOX2B, PMS2*, POT1, PRKAR1A, PTCH1, PTEN*, RAD51C*, RAD51D*, RB1, RET, SDHA, SDHAF2, SDHB, SDHC,  SDHD, SMAD4, SMARCA4, SMARCB1, SMARCE1, STK11, SUFU, TMEM127, TP53*, TSC1, TSC2, and VHL (sequencing and deletion/duplication); EGFR, EGLN1, HOXB13, KIT, MITF, PDGFRA, POLD1,  and POLE (sequencing only); EPCAM and GREM1 (deletion/duplication only). DNA and RNA analyses performed for * genes. The test report has been scanned into EPIC and is located under the Molecular Pathology section of the Results Review tab.  A portion of the result report is included below for reference.     We discussed with [name] that because current genetic testing is not perfect, it is possible there may be a gene mutation in one of these genes that current testing cannot detect, but that chance is small.  We also discussed, that there could be another gene that has not yet been discovered, or that we have not yet tested, that is responsible for the cancer diagnoses in the family. It is also possible there is a hereditary cause for the cancer in the family that [name] did not inherit and therefore was not identified in her testing.  Therefore, it is important to remain in touch with cancer genetics in the future so that we can continue to offer [name] the most up to date genetic testing.   ADDITIONAL GENETIC TESTING: We discussed with [name] that her genetic testing was fairly extensive.  If there are genes identified to increase cancer risk that can be analyzed in the future, we would be happy to discuss and coordinate this testing at that time.    CANCER SCREENING RECOMMENDATIONS: [name] test result is considered negative (normal).  This means that we have not identified a hereditary cause for her personal history of breast cancer at this time. Most cancers happen by chance and this negative test suggests that her personal history of cancer may fall into this category.    Possible reasons for [name]'s negative genetic test include:  1. There may be a gene mutation in one of these genes that current testing methods cannot detect but that chance is small.  2. There could be another gene that has not yet been discovered, or that we have not yet tested, that is  responsible for the cancer diagnoses in the family.  3.  There may be no hereditary risk for cancer in the family. The cancers in [name] and/or her family may be sporadic/familial or due to other genetic and environmental factors. 4. It is also possible there is a hereditary cause for the cancer in the family that [name] did not inherit.  Therefore, it is recommended she continue to follow the cancer management and screening guidelines provided by her oncology and primary healthcare provider. An individual's cancer risk and medical management are not determined by genetic test results alone. Overall cancer risk assessment incorporates additional factors, including personal medical history, family history, and any available genetic information that may result in a personalized plan for cancer prevention and surveillance  RECOMMENDATIONS FOR FAMILY MEMBERS:  Individuals in this family might be at some increased risk of developing cancer, over the general population risk, simply due to the family history of cancer.  We recommended women in this family have a yearly mammogram beginning at age 13, or 80 years younger than the earliest onset of cancer, an annual clinical breast exam, and perform monthly breast self-exams. Women in this family should also have a gynecological exam as recommended by their primary provider. All family members should be referred for colonoscopy starting at age 35.  FOLLOW-UP: Lastly, we  discussed with [name] that cancer genetics is a rapidly advancing field and it is possible that new genetic tests will be appropriate for her and/or her family members in the future. We encouraged her to remain in contact with cancer genetics on an annual basis so we can update her personal and family histories and let her know of advances in cancer genetics that may benefit this family.   Our contact number was provided. [name] questions were answered to her satisfaction, and  she knows she is welcome to call us at anytime with additional questions or concerns.   Maylon Cos, MS, [facility] Licensed, Certified Genetic Counselor [email or web address]

## 2023-06-03 NOTE — Progress Notes (Signed)
Caitlin Burch was called today for follow-up after completing radiation to her left breast on 05-11-23.  Pain: denies pain but does report some numbness at lymph node area under arm. She does report this is improving.  Skin: healing well, encouraged use of vitamin E cream/lotion for two months ROM: normal ROM Fatigue: reports improvement though she continues to need to sleep more at times Lymphedema: none to report MedOnc F/U: Lillard Anes on 08-23-23 Other issues of note: Pt and family had no concerns at this time. They were grateful for the care and support they received during treatment.   Pt reports Yes No Comments  Tamoxifen []  [x]    Letrozole []  [x]    Anastrazole [x]  []  1mg   Mammogram []  Date:  []  Encouraged yearly mammogram screenings.

## 2023-06-04 DIAGNOSIS — E041 Nontoxic single thyroid nodule: Secondary | ICD-10-CM | POA: Diagnosis not present

## 2023-06-04 DIAGNOSIS — E559 Vitamin D deficiency, unspecified: Secondary | ICD-10-CM | POA: Diagnosis not present

## 2023-06-04 DIAGNOSIS — R7303 Prediabetes: Secondary | ICD-10-CM | POA: Diagnosis not present

## 2023-06-04 DIAGNOSIS — I10 Essential (primary) hypertension: Secondary | ICD-10-CM | POA: Diagnosis not present

## 2023-06-04 DIAGNOSIS — E785 Hyperlipidemia, unspecified: Secondary | ICD-10-CM | POA: Diagnosis not present

## 2023-06-04 DIAGNOSIS — Z79899 Other long term (current) drug therapy: Secondary | ICD-10-CM | POA: Diagnosis not present

## 2023-06-08 DIAGNOSIS — I700 Atherosclerosis of aorta: Secondary | ICD-10-CM | POA: Diagnosis not present

## 2023-06-08 DIAGNOSIS — I10 Essential (primary) hypertension: Secondary | ICD-10-CM | POA: Diagnosis not present

## 2023-06-08 DIAGNOSIS — Z79899 Other long term (current) drug therapy: Secondary | ICD-10-CM | POA: Diagnosis not present

## 2023-06-08 DIAGNOSIS — I2510 Atherosclerotic heart disease of native coronary artery without angina pectoris: Secondary | ICD-10-CM | POA: Diagnosis not present

## 2023-06-08 DIAGNOSIS — C50912 Malignant neoplasm of unspecified site of left female breast: Secondary | ICD-10-CM | POA: Diagnosis not present

## 2023-06-08 DIAGNOSIS — Z0000 Encounter for general adult medical examination without abnormal findings: Secondary | ICD-10-CM | POA: Diagnosis not present

## 2023-06-08 DIAGNOSIS — E785 Hyperlipidemia, unspecified: Secondary | ICD-10-CM | POA: Diagnosis not present

## 2023-06-08 DIAGNOSIS — E559 Vitamin D deficiency, unspecified: Secondary | ICD-10-CM | POA: Diagnosis not present

## 2023-06-08 DIAGNOSIS — R7303 Prediabetes: Secondary | ICD-10-CM | POA: Diagnosis not present

## 2023-06-08 DIAGNOSIS — E041 Nontoxic single thyroid nodule: Secondary | ICD-10-CM | POA: Diagnosis not present

## 2023-06-17 ENCOUNTER — Telehealth: Payer: Self-pay

## 2023-06-17 ENCOUNTER — Ambulatory Visit
Admission: RE | Admit: 2023-06-17 | Discharge: 2023-06-17 | Disposition: A | Discharge status: Home / Self Care | Payer: Medicare HMO | Source: Ambulatory Visit | Attending: Radiation Oncology | Admitting: Radiation Oncology

## 2023-06-17 ENCOUNTER — Encounter: Payer: Self-pay | Admitting: Radiation Oncology

## 2023-06-17 NOTE — Telephone Encounter (Signed)
Rn called pt for telephone follow up . Pt had no concerns or questions at this time. Follow up note completed and routed to Dr. Basilio Cairo.

## 2023-08-05 IMAGING — MG MM DIGITAL SCREENING BILAT W/ TOMO AND CAD
8 series · 8 of 24 positions shown · non-contrast
Comparison: Previous exam(s).

CLINICAL DATA: Screening.

EXAM:
DIGITAL SCREENING BILATERAL MAMMOGRAM WITH TOMOSYNTHESIS AND CAD
TECHNIQUE: Bilateral screening digital craniocaudal and mediolateral oblique
mammograms were obtained. Bilateral screening digital breast
tomosynthesis was performed. The images were evaluated with
computer-aided detection.

[R CC synth-2D]
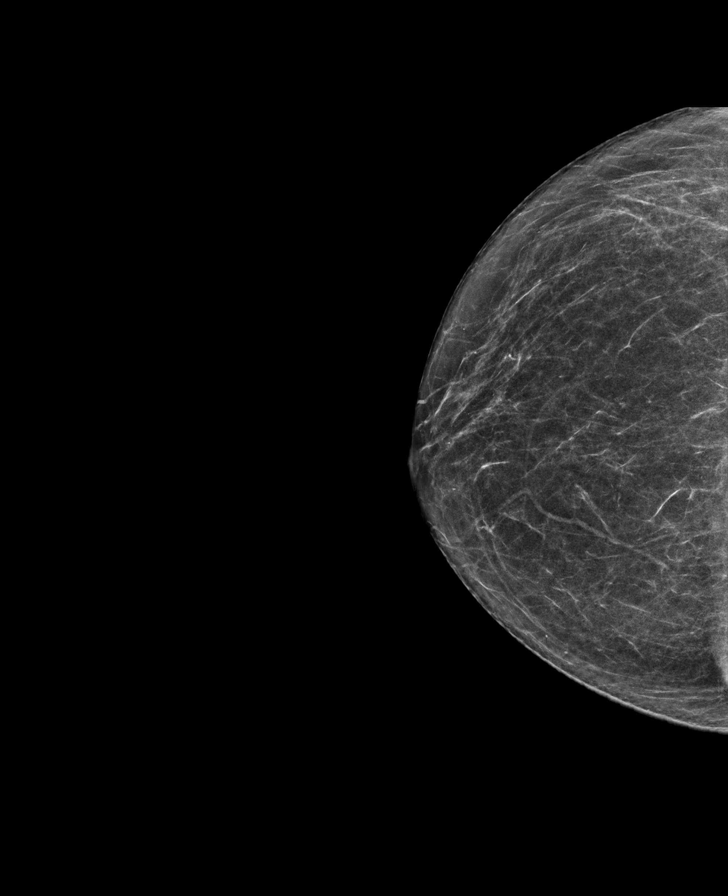

[R MLO synth-2D]
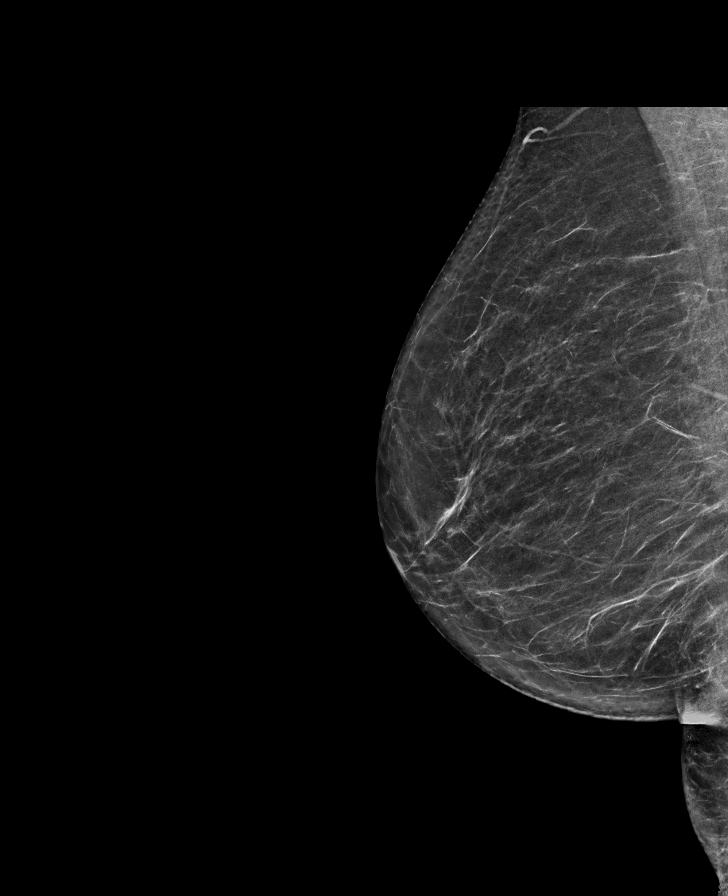

[L MLO synth-2D]
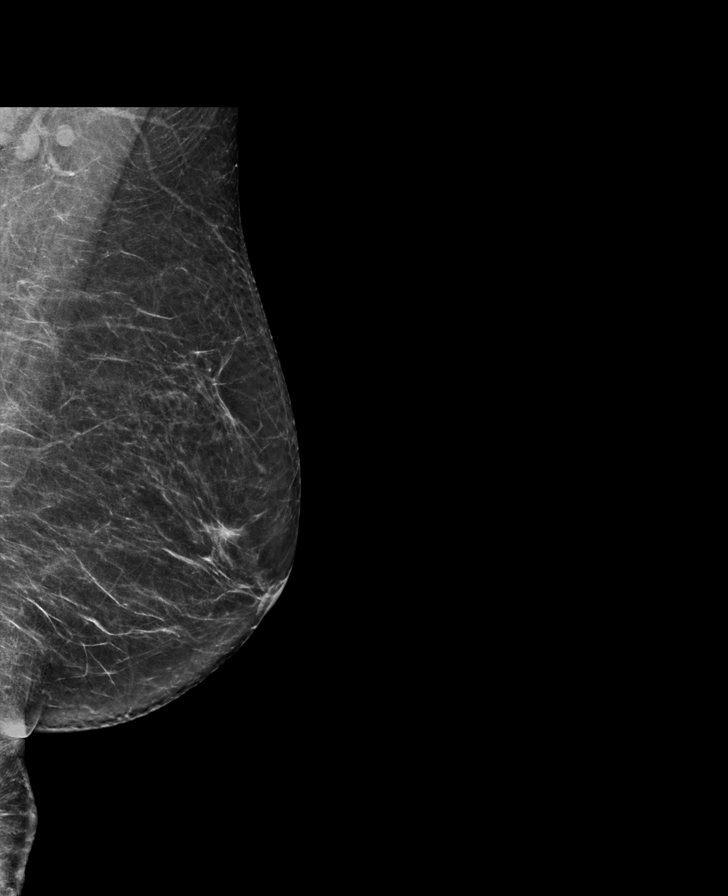

[L CC synth-2D]
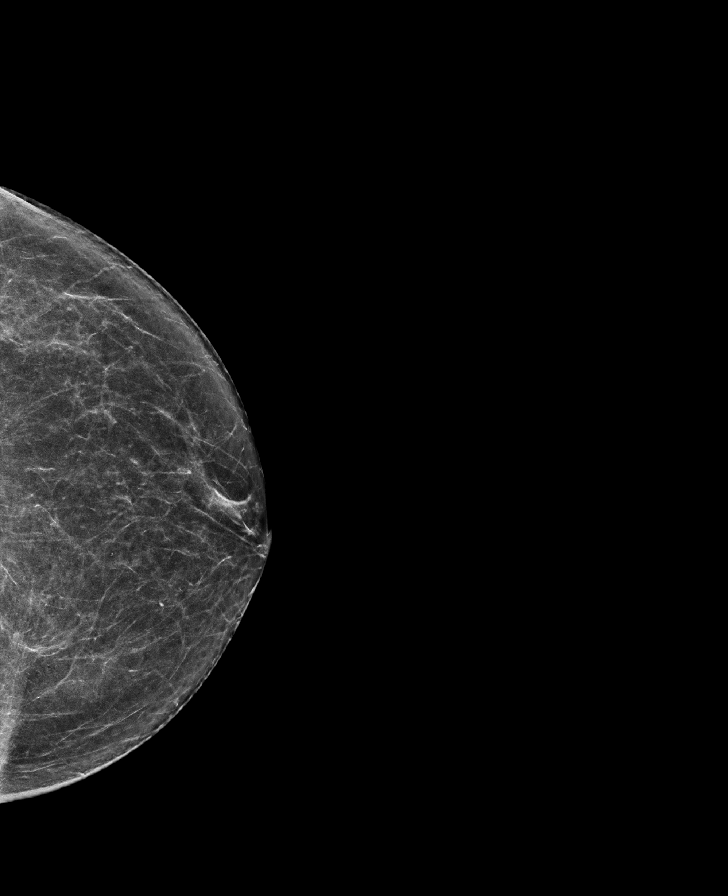

[R CC tomo · tomo slice 32/63.0]
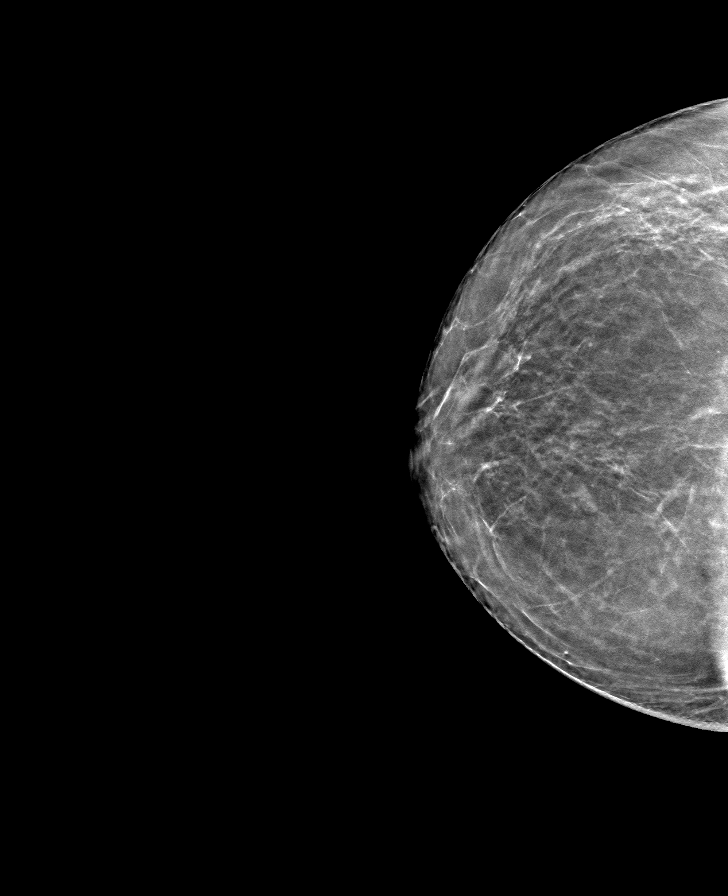

[R MLO tomo · tomo slice 34/67.0]
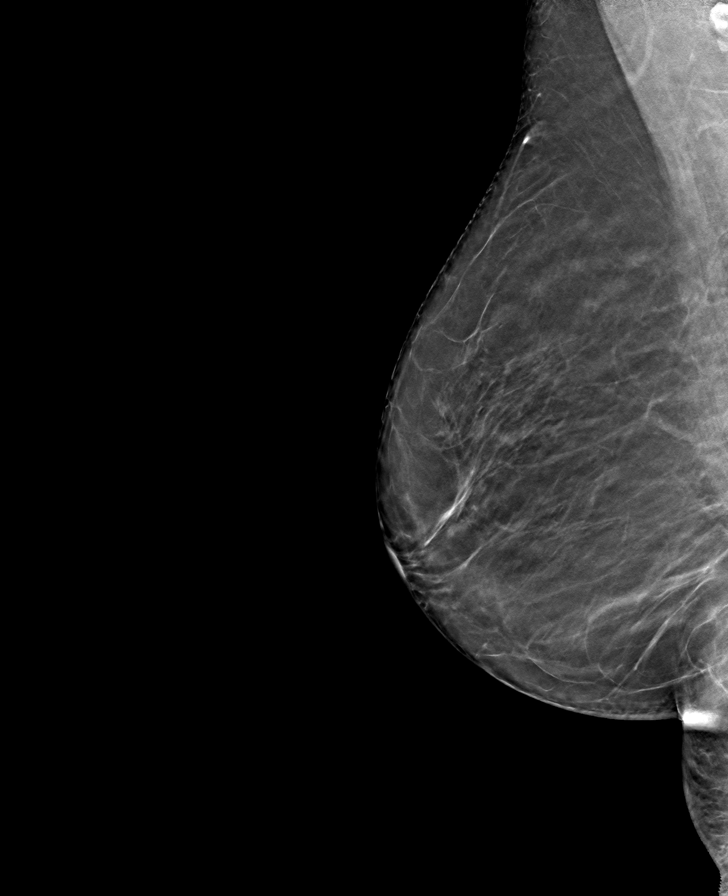

[L MLO tomo · tomo slice 35/70.0]
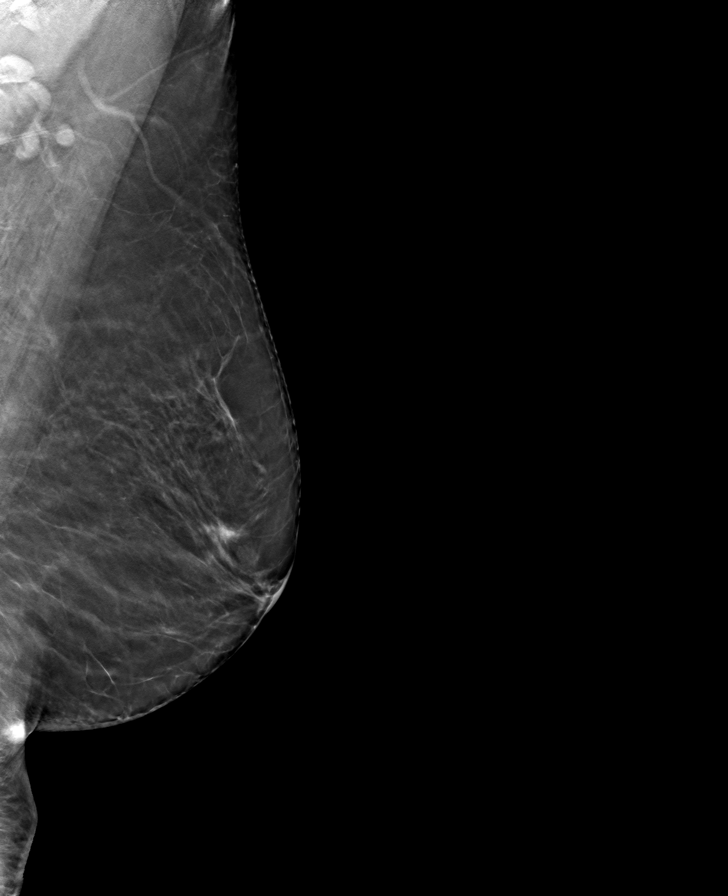

[L CC tomo · tomo slice 33/66.0]
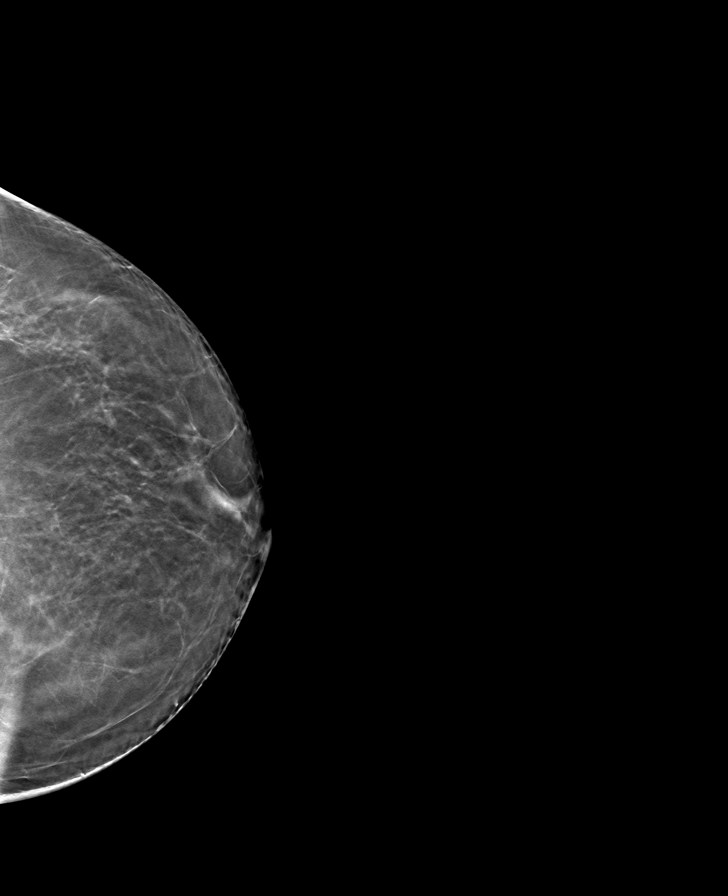

[8 of 24 positions shown; findings below may reference images not displayed]

ACR Breast Density Category b: There are scattered areas of
fibroglandular density.
FINDINGS: There are no findings suspicious for malignancy.
IMPRESSION: No mammographic evidence of malignancy. A result letter of this
screening mammogram will be mailed directly to the patient.

RECOMMENDATION:
Screening mammogram in one year. (Code:51-O-LD2)

BI-RADS CATEGORY  1: Negative.

## 2023-08-23 ENCOUNTER — Encounter: Payer: Self-pay | Admitting: Adult Health

## 2023-08-23 ENCOUNTER — Other Ambulatory Visit: Payer: Self-pay

## 2023-08-23 ENCOUNTER — Encounter: Payer: Self-pay | Admitting: Hematology and Oncology

## 2023-08-23 ENCOUNTER — Inpatient Hospital Stay: Payer: Medicare HMO | Attending: Hematology and Oncology | Admitting: Adult Health

## 2023-08-23 VITALS — BP 114/77 | HR 69 | Temp 97.6°F | Resp 16 | Wt 118.3 lb

## 2023-08-23 DIAGNOSIS — Z923 Personal history of irradiation: Secondary | ICD-10-CM | POA: Diagnosis not present

## 2023-08-23 DIAGNOSIS — I341 Nonrheumatic mitral (valve) prolapse: Secondary | ICD-10-CM | POA: Diagnosis not present

## 2023-08-23 DIAGNOSIS — Z79899 Other long term (current) drug therapy: Secondary | ICD-10-CM | POA: Insufficient documentation

## 2023-08-23 DIAGNOSIS — Z79811 Long term (current) use of aromatase inhibitors: Secondary | ICD-10-CM | POA: Insufficient documentation

## 2023-08-23 DIAGNOSIS — I10 Essential (primary) hypertension: Secondary | ICD-10-CM | POA: Insufficient documentation

## 2023-08-23 DIAGNOSIS — M810 Age-related osteoporosis without current pathological fracture: Secondary | ICD-10-CM

## 2023-08-23 DIAGNOSIS — Z170 Estrogen receptor positive status [ER+]: Secondary | ICD-10-CM | POA: Diagnosis not present

## 2023-08-23 DIAGNOSIS — C50212 Malignant neoplasm of upper-inner quadrant of left female breast: Secondary | ICD-10-CM | POA: Insufficient documentation

## 2023-08-23 NOTE — Progress Notes (Signed)
SURVIVORSHIP VISIT:  BRIEF ONCOLOGIC HISTORY:  Oncology History  Malignant neoplasm of upper-inner quadrant of left breast in female, estrogen receptor positive (HCC)  01/29/2023 Initial Diagnosis   Screening mammogram detected left breast mass at 9:30 position measuring 0.4 cm, ultrasound-guided biopsy: Grade 2 IDC ER 90%, PR 30%, HER2 positive 3+ by IHC, Ki-67 30%   02/10/2023 Cancer Staging   Staging form: Breast, AJCC 8th Edition - Clinical: Stage IA (cT1a, cN0, cM0, G2, ER+, PR+, HER2+) - Signed by Serena Croissant, MD on 02/10/2023 Histologic grading system: 3 grade system   02/26/2023 Surgery   Left lumpectomy: Grade 2 IDC 0.4 cm with DCIS, margins negative, LVI not identified, ER 90%, PR 30%, HER2 3+ positive, Ki-67 30%, 0/4 lymph nodes negative   04/14/2023 - 05/11/2023 Radiation Therapy   Plan Name: Breast_L Site: Breast, Left Technique: 3D Mode: Photon Dose Per Fraction: 2.67 Gy Prescribed Dose (Delivered / Prescribed): 40.05 Gy / 40.05 Gy Prescribed Fxs (Delivered / Prescribed): 15 / 15   Plan Name: Breast_L_Bst Site: Breast, Left Technique: 3D Mode: Photon Dose Per Fraction: 2 Gy Prescribed Dose (Delivered / Prescribed): 10 Gy / 10 Gy Prescribed Fxs (Delivered / Prescribed): 5 / 5   04/15/2023 - 05/11/2023 Radiation Therapy   Adjuvant radiation   05/12/2023 -  Anti-estrogen oral therapy   Anastrozole 1 mg daily    Genetic Testing   [facility] CancerNext-Expanded Panel+RNA was Negative. Report date is 05/26/2023.   Caitlin CancerNext-Expanded gene panel offered by [facility] and includes sequencing, rearrangement, and RNA analysis for Caitlin following 71 genes: AIP, ALK, APC, ATM, AXIN2, BAP1, BARD1, BMPR1A, BRCA1, BRCA2, BRIP1, CDC73, CDH1, CDK4, CDKN1B, CDKN2A, CHEK2, CTNNA1, DICER1, FH, FLCN, KIF1B, LZTR1, MAX, MEN1, MET, MLH1, MSH2, MSH3, MSH6, MUTYH, NF1, NF2, NTHL1, PALB2, PHOX2B, PMS2, POT1, PRKAR1A, PTCH1, PTEN, RAD51C, RAD51D, RB1, RET, SDHA, SDHAF2, SDHB, SDHC, SDHD, SMAD4,  SMARCA4, SMARCB1, SMARCE1, STK11, SUFU, TMEM127, TP53, TSC1, TSC2, and VHL (sequencing and deletion/duplication); EGFR, EGLN1, HOXB13, KIT, MITF, PDGFRA, POLD1, and POLE (sequencing only); EPCAM and GREM1 (deletion/duplication only).    05/2023 -  Anti-estrogen oral therapy   1 mg Anastrozole daily x 5 years     INTERVAL HISTORY:  Caitlin Burch to review her survivorship care plan detailing her treatment course for breast cancer, as well as monitoring long-term side effects of that treatment, education regarding health maintenance, screening, and overall wellness and health promotion.     Overall, Caitlin Burch reports feeling quite well. She has been on a regimen of anastrozole and Prolia. She reports tolerating Caitlin medications well, with no significant side effects such as hot flashes, vaginal dryness, or joint aches. She has been receiving Prolia injections every six months, with Caitlin last one believed to be in August. She tolerated this well.    Post lumpectomy, Caitlin Burch reports occasional pain at Caitlin surgical site, describing it as a 'twinge.' She also notes a sensation of hardness in Caitlin area at times, which causes her some concern. However, she reports overall good healing with no residual issues.  Caitlin Burch underwent radiation therapy, with Caitlin dates and dosages listed in her care plan. She reports some tiredness, skin discoloration, and breast swelling, which she understands may take up to a year to resolve.  Caitlin Burch also has a history of lymph node removal, with four nodes excised. She has been attending physical therapy sessions for lymphedema prevention, using a machine for quick assessments of her lymphatic drainage.  Caitlin Burch's cholesterol levels have been stable,  as reported during a recent primary care visit.  She has been wearing compression garments during flights to manage potential lymphedema.  REVIEW OF SYSTEMS:  Review of Systems  Constitutional:  Negative for  appetite change, chills, fatigue, fever and unexpected weight change.  HENT:   Negative for hearing loss, lump/mass and trouble swallowing.   Eyes:  Negative for eye problems and icterus.  Respiratory:  Negative for chest tightness, cough and shortness of breath.   Cardiovascular:  Negative for chest pain, leg swelling and palpitations.  Gastrointestinal:  Negative for abdominal distention, abdominal pain, constipation, diarrhea, nausea and vomiting.  Endocrine: Negative for hot flashes.  Genitourinary:  Negative for difficulty urinating.   Musculoskeletal:  Negative for arthralgias.  Skin:  Negative for itching and rash.  Neurological:  Negative for dizziness, extremity weakness, headaches and numbness.  Hematological:  Negative for adenopathy. Does not bruise/bleed easily.  Psychiatric/Behavioral:  Negative for depression. Caitlin Burch is not nervous/anxious.    Breast: Denies any new nodularity, masses, tenderness, nipple changes, or nipple discharge.       PAST MEDICAL/SURGICAL HISTORY:  Past Medical History:  Diagnosis Date   Breast cancer (HCC)    Hypertension    MVP (mitral valve prolapse)    questionable   Past Surgical History:  Procedure Laterality Date   BREAST BIOPSY Left 01/29/2023   [place] LT BREAST BX W LOC DEV 1ST LESION IMG BX SPEC US GUIDE 01/29/2023 [facility] MAMMOGRAPHY   BREAST BIOPSY  02/23/2023   MM LT RADIOACTIVE SEED LOC MAMMO GUIDE 02/23/2023 [facility] MAMMOGRAPHY   BREAST LUMPECTOMY WITH RADIOACTIVE SEED AND SENTINEL LYMPH NODE BIOPSY Left 02/23/2023   Procedure: LEFT BREAST LUMPECTOMY WITH RADIOACTIVE SEED;  Surgeon: Almond Lint, MD;  Location: [facility] OR;  Service: General;  Laterality: Left;  90 MIN ROOM 2   MASTECTOMY W/ SENTINEL NODE BIOPSY Left 02/23/2023   Procedure: SENTINEL LYMPH NODE BIOPSY;  Surgeon: Almond Lint, MD;  Location: [facility] OR;  Service: General;  Laterality: Left;   no surgical hx       ALLERGIES:  No Known Allergies   CURRENT MEDICATIONS:   Outpatient Encounter Medications as of 08/23/2023  Medication Sig   anastrozole (ARIMIDEX) 1 MG tablet Take 1 tablet (1 mg total) by mouth daily.   atorvastatin (LIPITOR) 10 MG tablet Take 10 mg by mouth daily.   CALCIUM PO Take 1 tablet by mouth daily.   Cholecalciferol (VITAMIN D3) 1000 units CAPS Take 1,000 Units by mouth daily.   Cyanocobalamin (B-12 PO) Take 1 tablet by mouth daily.   Ferrous Sulfate (IRON) 325 (65 Fe) MG TABS Take 325 mg by mouth daily.   lisinopril (PRINIVIL,ZESTRIL) 20 MG tablet TAKE 1 TABLET (20 MG TOTAL) BY MOUTH DAILY.   metoprolol succinate (TOPROL-XL) 25 MG 24 hr tablet TAKE 1 TABLET (25 MG TOTAL) BY MOUTH DAILY.   Multiple Vitamin (MULTIVITAMIN) capsule Take 1 capsule by mouth daily.   No facility-administered encounter medications on file as of 08/23/2023.     ONCOLOGIC FAMILY HISTORY:  Family History  Problem Relation Age of Onset   Liver cancer Mother 13   Breast cancer Neg Hx      SOCIAL HISTORY:  Social History   Socioeconomic History   Marital status: Married    Spouse name: Not on file   Number of children: Not on file   Years of education: Not on file   Highest education level: Not on file  Occupational History   Not on file  Tobacco Use  Smoking status: Never   Smokeless tobacco: Never  Vaping Use   Vaping status: Never Used  Substance and Sexual Activity   Alcohol use: Yes    Alcohol/week: 1.0 standard drink of alcohol    Types: 1 Glasses of wine per week   Drug use: Never   Sexual activity: Not on file  Other Topics Concern   Not on file  Social History Narrative   Not on file   Social Determinants of Health   Financial Resource Strain: Not on file  Food Insecurity: No Food Insecurity (03/23/2023)   Hunger Vital Sign    Worried About Running Out of Food in Caitlin Last Year: Never true    Ran Out of Food in Caitlin Last Year: Never true  Transportation Needs: No Transportation Needs (03/23/2023)   PRAPARE - Therapist, art (Medical): No    Lack of Transportation (Non-Medical): No  Physical Activity: Not on file  Stress: Not on file  Social Connections: Not on file  Intimate Partner Violence: Not At Risk (03/23/2023)   Humiliation, Afraid, Rape, and Kick questionnaire    Fear of Current or Ex-Partner: No    Emotionally Abused: No    Physically Abused: No    Sexually Abused: No     OBSERVATIONS/OBJECTIVE:  There were no vitals taken for this visit. GENERAL: Burch is a well appearing female in no acute distress HEENT:  Sclerae anicteric.  Oropharynx clear and moist. No ulcerations or evidence of oropharyngeal candidiasis. Neck is supple.  NODES:  No cervical, supraclavicular, or axillary lymphadenopathy palpated.  BREAST EXAM:  left breast s/p lumpectomy and radiation, no sign of local recurrence, right breast benign LUNGS:  Clear to auscultation bilaterally.  No wheezes or rhonchi. HEART:  Regular rate and rhythm. No murmur appreciated. ABDOMEN:  Soft, nontender.  Positive, normoactive bowel sounds. No organomegaly palpated. MSK:  No focal spinal tenderness to palpation. Full range of motion bilaterally in Caitlin upper extremities. EXTREMITIES:  No peripheral edema.   SKIN:  Clear with no obvious rashes or skin changes. No nail dyscrasia. NEURO:  Nonfocal. Well oriented.  Appropriate affect.   LABORATORY DATA:  None for this visit.  DIAGNOSTIC IMAGING:  None for this visit.      ASSESSMENT AND PLAN:  Caitlin Burch is a pleasant 66 y.o. female with Stage IA left breast invasive ductal carcinoma, ER+/PR+/HER2+, diagnosed in 01/2023, treated with lumpectomy, adjuvant radiation therapy, and anti-estrogen therapy with Anastrozole beginning in 05/2023.  [facility] for our initial meeting and routine follow-up post-completion of treatment for breast cancer.    1. Stage IA left breast cancer:  Caitlin Burch is continuing to recover from definitive  treatment for breast cancer. She will follow-up with her medical oncologist, Dr.  Pamelia Hoit in 04/2024 months with history and physical exam per surveillance protocol.  She will continue her anti-estrogen therapy with Anastrozole. Thus far, she is tolerating Caitlin Anastrozole well, with minimal side effects. Her mammogram is due 01/2024; orders placed today.   Today, a comprehensive survivorship care plan and treatment summary was reviewed with Caitlin Burch today detailing her breast cancer diagnosis, treatment course, potential late/long-term effects of treatment, appropriate follow-up care with recommendations for Caitlin future, and Burch education resources.  A copy of this summary, along with a letter will be sent to Caitlin Burch's primary care provider via mail/fax/In Basket message after today's visit.    2. Bone health:  Given Caitlin Burch's age/history of  breast cancer and her current treatment regimen including anti-estrogen therapy with Anastrozole, she is at risk for bone demineralization.  Her last DEXA scan occurred in 02/2022 and demonstrated osteoporosis with a t score of -3.4 in Caitlin left forearm.  She bagan Prolia on 05/26/2023 for this and will continue it every 6 months.  She was given education on specific activities to promote bone health.  3. Cancer screening:  Due to Caitlin Burch's history and her age, she should receive screening for skin cancers, colon cancer, and gynecologic cancers.  Caitlin information and recommendations are listed on Caitlin Burch's comprehensive care plan/treatment summary and were reviewed in detail with Caitlin Burch.    4. Health maintenance and wellness promotion: Caitlin Burch was encouraged to consume 5-7 servings of fruits and vegetables per day. We reviewed Caitlin 6 pillars of lifestyle medicine handout.  She was also encouraged to engage in moderate to vigorous exercise for 30 minutes per day most days of Caitlin week.  She was instructed to limit her alcohol consumption and continue  to abstain from tobacco use.     5. Support services/counseling: It is not uncommon for this period of Caitlin Burch's cancer care trajectory to be one of many emotions and stressors.   She was given information regarding our available services and encouraged to contact me with any questions or for help enrolling in any of our support group/programs.    Follow up instructions:    -Return to cancer center in 04/2024 for f/u with Dr. Pamelia Hoit -Labs and Prolia in 10/2023 -Mammogram due in 01/2024 -Bone density testing in 02/2024 -She is welcome to return back to [facility] at any time; no additional follow-up needed at this time.  -Consider referral back to survivorship as a long-term survivor for continued surveillance  Caitlin Burch was provided an opportunity to ask questions and all were answered. Caitlin Burch agreed with Caitlin plan and demonstrated an understanding of Caitlin instructions.   Total encounter time:30 minutes*in face-to-face visit time, chart review, lab review, care coordination, order entry, and documentation of Caitlin encounter time.    Lillard Anes, NP 08/23/23 11:07 AM Medical Oncology and Hematology [facility] [address] [place], [place] [address] Tel. [phone]    Fax. [phone]  *Total Encounter Time as defined by [facility] for Medicare and Medicaid Services includes, in addition to Caitlin face-to-face time of a Burch visit (documented in Caitlin note above) non-face-to-face time: obtaining and reviewing outside history, ordering and reviewing medications, tests or procedures, care coordination (communications with other health care professionals or caregivers) and documentation in Caitlin medical record.

## 2023-08-30 ENCOUNTER — Ambulatory Visit: Payer: Medicare HMO | Attending: General Surgery

## 2023-08-30 VITALS — Wt 117.4 lb

## 2023-08-30 DIAGNOSIS — Z483 Aftercare following surgery for neoplasm: Secondary | ICD-10-CM | POA: Insufficient documentation

## 2023-08-30 NOTE — Therapy (Signed)
  OUTPATIENT PHYSICAL THERAPY SOZO SCREENING NOTE   Patient Name: Caitlin Burch MRN: [id number] DOB:1956-03-26, 67 y.o., female Today's Date: 08/30/2023  PCP: Gretel Acre, MD REFERRING PROVIDER: Almond Lint, MD   PT End of Session - 08/30/23 1045     Visit Number 2   # unchanged due to screen only   PT Start Time 1044    PT Stop Time 1048    PT Time Calculation (min) 4 min    Activity Tolerance Patient tolerated treatment well    Behavior During Therapy [facility] for tasks assessed/performed             Past Medical History:  Diagnosis Date   Breast cancer (HCC)    Hypertension    MVP (mitral valve prolapse)    questionable   Past Surgical History:  Procedure Laterality Date   BREAST BIOPSY Left 01/29/2023   [place] LT BREAST BX W LOC DEV 1ST LESION IMG BX SPEC US GUIDE 01/29/2023 [facility]   BREAST BIOPSY  02/23/2023   MM LT RADIOACTIVE SEED LOC MAMMO GUIDE 02/23/2023 [facility]   BREAST LUMPECTOMY WITH RADIOACTIVE SEED AND SENTINEL LYMPH NODE BIOPSY Left 02/23/2023   Procedure: LEFT BREAST LUMPECTOMY WITH RADIOACTIVE SEED;  Surgeon: Almond Lint, MD;  Location: [facility];  Service: General;  Laterality: Left;  90 MIN ROOM 2   MASTECTOMY W/ SENTINEL NODE BIOPSY Left 02/23/2023   Procedure: SENTINEL LYMPH NODE BIOPSY;  Surgeon: Almond Lint, MD;  Location: [facility];  Service: General;  Laterality: Left;   no surgical hx     Patient Active Problem List   Diagnosis Date Noted   Genetic testing 05/27/2023   Osteoporosis 05/12/2023   Malignant neoplasm of upper-inner quadrant of left breast in female, estrogen receptor positive (HCC) 02/08/2023   Chronic systolic CHF (congestive heart failure) (HCC) 09/21/2014   Chronic renal failure, stage 4 (severe) (HCC) 09/21/2014    REFERRING DIAG: left breast cancer at risk for lymphedema  THERAPY DIAG:  Aftercare following surgery for neoplasm  PERTINENT HISTORY: Left lumpectomy on 02/23/23 with 4 negative nodes removed.   Patient was diagnosed on 01/11/2023 with left grade 2 invasive ductal carcinoma breast cancer. It measures 4 mm and is located in the upper inner quadrant. It is triple positive with a Ki67 of 30%.   PRECAUTIONS: left UE Lymphedema risk  SUBJECTIVE: Pt returns for her 3 month L-Dex screen.   PAIN:  Are you having pain? No  SOZO SCREENING: Patient was assessed today using the SOZO machine to determine the lymphedema index score. This was compared to her baseline score. It was determined that she is within the recommended range when compared to her baseline and no further action is needed at this time. She will continue SOZO screenings. These are done every 3 months for 2 years post operatively followed by every 6 months for 2 years, and then annually.   L-DEX FLOWSHEETS - 08/30/23 1000       L-DEX LYMPHEDEMA SCREENING   Measurement Type Unilateral    L-DEX MEASUREMENT EXTREMITY Upper Extremity    POSITION  Standing    DOMINANT SIDE Right    At Risk Side Left    BASELINE SCORE (UNILATERAL) -0.4    L-DEX SCORE (UNILATERAL) 0.3    VALUE CHANGE (UNILAT) 0.7              Hermenia Bers, PTA 08/30/2023, 10:48 AM

## 2023-09-07 DIAGNOSIS — I10 Essential (primary) hypertension: Secondary | ICD-10-CM | POA: Diagnosis not present

## 2023-09-07 DIAGNOSIS — Z809 Family history of malignant neoplasm, unspecified: Secondary | ICD-10-CM | POA: Diagnosis not present

## 2023-09-07 DIAGNOSIS — E785 Hyperlipidemia, unspecified: Secondary | ICD-10-CM | POA: Diagnosis not present

## 2023-09-07 DIAGNOSIS — Z8249 Family history of ischemic heart disease and other diseases of the circulatory system: Secondary | ICD-10-CM | POA: Diagnosis not present

## 2023-09-07 DIAGNOSIS — Z79811 Long term (current) use of aromatase inhibitors: Secondary | ICD-10-CM | POA: Diagnosis not present

## 2023-09-07 DIAGNOSIS — M810 Age-related osteoporosis without current pathological fracture: Secondary | ICD-10-CM | POA: Diagnosis not present

## 2023-09-07 DIAGNOSIS — R7303 Prediabetes: Secondary | ICD-10-CM | POA: Diagnosis not present

## 2023-09-07 DIAGNOSIS — C50919 Malignant neoplasm of unspecified site of unspecified female breast: Secondary | ICD-10-CM | POA: Diagnosis not present

## 2023-09-17 DIAGNOSIS — C50212 Malignant neoplasm of upper-inner quadrant of left female breast: Secondary | ICD-10-CM | POA: Diagnosis not present

## 2023-09-17 DIAGNOSIS — Z170 Estrogen receptor positive status [ER+]: Secondary | ICD-10-CM | POA: Diagnosis not present

## 2023-10-25 ENCOUNTER — Other Ambulatory Visit: Payer: Self-pay | Admitting: *Deleted

## 2023-10-25 DIAGNOSIS — Z170 Estrogen receptor positive status [ER+]: Secondary | ICD-10-CM

## 2023-10-26 ENCOUNTER — Other Ambulatory Visit: Payer: Medicare HMO

## 2023-10-26 ENCOUNTER — Inpatient Hospital Stay: Payer: Medicare HMO

## 2023-10-26 ENCOUNTER — Ambulatory Visit: Payer: Medicare HMO

## 2023-10-26 ENCOUNTER — Inpatient Hospital Stay: Payer: Medicare HMO | Attending: Hematology and Oncology

## 2023-10-26 VITALS — BP 111/86 | HR 71 | Temp 98.4°F | Resp 16

## 2023-10-26 DIAGNOSIS — Z79811 Long term (current) use of aromatase inhibitors: Secondary | ICD-10-CM | POA: Insufficient documentation

## 2023-10-26 DIAGNOSIS — Z1721 Progesterone receptor positive status: Secondary | ICD-10-CM | POA: Diagnosis not present

## 2023-10-26 DIAGNOSIS — Z1731 Human epidermal growth factor receptor 2 positive status: Secondary | ICD-10-CM | POA: Diagnosis not present

## 2023-10-26 DIAGNOSIS — Z170 Estrogen receptor positive status [ER+]: Secondary | ICD-10-CM | POA: Diagnosis not present

## 2023-10-26 DIAGNOSIS — C50212 Malignant neoplasm of upper-inner quadrant of left female breast: Secondary | ICD-10-CM

## 2023-10-26 DIAGNOSIS — M810 Age-related osteoporosis without current pathological fracture: Secondary | ICD-10-CM | POA: Insufficient documentation

## 2023-10-26 LAB — CBC WITH DIFFERENTIAL (CANCER CENTER ONLY)
Abs Immature Granulocytes: 0.01 10*3/uL (ref 0.00–0.07)
Basophils Absolute: 0 10*3/uL (ref 0.0–0.1)
Basophils Relative: 0 %
Eosinophils Absolute: 0.2 10*3/uL (ref 0.0–0.5)
Eosinophils Relative: 3 %
HCT: 41.1 % (ref 36.0–46.0)
Hemoglobin: 13.6 g/dL (ref 12.0–15.0)
Immature Granulocytes: 0 %
Lymphocytes Relative: 24 %
Lymphs Abs: 1.4 10*3/uL (ref 0.7–4.0)
MCH: 31.4 pg (ref 26.0–34.0)
MCHC: 33.1 g/dL (ref 30.0–36.0)
MCV: 94.9 fL (ref 80.0–100.0)
Monocytes Absolute: 0.6 10*3/uL (ref 0.1–1.0)
Monocytes Relative: 11 %
Neutro Abs: 3.7 10*3/uL (ref 1.7–7.7)
Neutrophils Relative %: 62 %
Platelet Count: 201 10*3/uL (ref 150–400)
RBC: 4.33 MIL/uL (ref 3.87–5.11)
RDW: 11.9 % (ref 11.5–15.5)
WBC Count: 5.9 10*3/uL (ref 4.0–10.5)
nRBC: 0 % (ref 0.0–0.2)

## 2023-10-26 LAB — CMP (CANCER CENTER ONLY)
ALT: 14 U/L (ref 0–44)
AST: 19 U/L (ref 15–41)
Albumin: 4.5 g/dL (ref 3.5–5.0)
Alkaline Phosphatase: 62 U/L (ref 38–126)
Anion gap: 6 (ref 5–15)
BUN: 16 mg/dL (ref 8–23)
CO2: 31 mmol/L (ref 22–32)
Calcium: 10.6 mg/dL — ABNORMAL HIGH (ref 8.9–10.3)
Chloride: 103 mmol/L (ref 98–111)
Creatinine: 0.79 mg/dL (ref 0.44–1.00)
GFR, Estimated: >60 mL/min (ref >60)
Glucose, Bld: 90 mg/dL (ref 70–99)
Potassium: 4.4 mmol/L (ref 3.5–5.1)
Sodium: 140 mmol/L (ref 135–145)
Total Bilirubin: 0.6 mg/dL (ref 0.0–1.2)
Total Protein: 7.9 g/dL (ref 6.5–8.1)

## 2023-10-26 MED ORDER — DENOSUMAB 60 MG/ML ~~LOC~~ SOSY
60.0000 mg | PREFILLED_SYRINGE | Freq: Once | SUBCUTANEOUS | Status: AC
Start: 2023-10-26 — End: 2023-10-26
  Administered 2023-10-26: 60 mg via SUBCUTANEOUS
  Filled 2023-10-26: qty 1

## 2023-11-01 ENCOUNTER — Ambulatory Visit: Payer: Medicare HMO | Attending: General Surgery

## 2023-11-01 DIAGNOSIS — Z170 Estrogen receptor positive status [ER+]: Secondary | ICD-10-CM | POA: Insufficient documentation

## 2023-11-01 DIAGNOSIS — C50212 Malignant neoplasm of upper-inner quadrant of left female breast: Secondary | ICD-10-CM | POA: Insufficient documentation

## 2023-11-02 ENCOUNTER — Ambulatory Visit: Payer: Medicare HMO | Admitting: Physical Therapy

## 2023-11-02 DIAGNOSIS — C50212 Malignant neoplasm of upper-inner quadrant of left female breast: Secondary | ICD-10-CM

## 2023-11-02 NOTE — Therapy (Signed)
OUTPATIENT PHYSICAL THERAPY SOZO SCREENING NOTE   Patient Name: Caitlin Burch MRN: [id number] DOB:22-Sep-1956, 68 y.o., female Today's Date: 11/02/2023  PCP: Gretel Acre, MD REFERRING PROVIDER: Almond Lint, MD   PT End of Session - 11/02/23 1639     Visit Number 2   unchanged screen only   PT Start Time 0408    PT Stop Time 0409    PT Time Calculation (min) 1 min    Activity Tolerance Patient tolerated treatment well    Behavior During Therapy [facility] for tasks assessed/performed             Past Medical History:  Diagnosis Date   Breast cancer (HCC)    Hypertension    MVP (mitral valve prolapse)    questionable   Past Surgical History:  Procedure Laterality Date   BREAST BIOPSY Left 01/29/2023   [place] LT BREAST BX W LOC DEV 1ST LESION IMG BX SPEC US GUIDE 01/29/2023 [facility]   BREAST BIOPSY  02/23/2023   MM LT RADIOACTIVE SEED LOC MAMMO GUIDE 02/23/2023 [facility]   BREAST LUMPECTOMY WITH RADIOACTIVE SEED AND SENTINEL LYMPH NODE BIOPSY Left 02/23/2023   Procedure: LEFT BREAST LUMPECTOMY WITH RADIOACTIVE SEED;  Surgeon: Almond Lint, MD;  [facility];  Service: General;  Laterality: Left;  90 MIN ROOM 2   MASTECTOMY W/ SENTINEL NODE BIOPSY Left 02/23/2023   Procedure: SENTINEL LYMPH NODE BIOPSY;  Surgeon: Almond Lint, MD;  [facility];  Service: General;  Laterality: Left;   no surgical hx     Patient Active Problem List   Diagnosis Date Noted   Genetic testing 05/27/2023   Osteoporosis 05/12/2023   Malignant neoplasm of upper-inner quadrant of left breast in female, estrogen receptor positive (HCC) 02/08/2023   Chronic systolic CHF (congestive heart failure) (HCC) 09/21/2014   Chronic renal failure, stage 4 (severe) (HCC) 09/21/2014    REFERRING DIAG: left breast cancer at risk for lymphedema  THERAPY DIAG:  Malignant neoplasm of upper-inner quadrant of left breast in female, estrogen receptor positive (HCC)  PERTINENT HISTORY: Left  lumpectomy on 02/23/23 with 4 negative nodes removed.  Patient was diagnosed on 01/11/2023 with left grade 2 invasive ductal carcinoma breast cancer. It measures 4 mm and is located in the upper inner quadrant. It is triple positive with a Ki67 of 30%.   PRECAUTIONS: left UE Lymphedema risk  SUBJECTIVE: Pt returns for her 3 month L-Dex screen.   PAIN:  Are you having pain? No  SOZO SCREENING: Patient was assessed today using the SOZO machine to determine the lymphedema index score. This was compared to her baseline score. It was determined that she is within the recommended range when compared to her baseline and no further action is needed at this time. She will continue SOZO screenings. These are done every 3 months for 2 years post operatively followed by every 6 months for 2 years, and then annually.   L-DEX FLOWSHEETS - 11/02/23 1600       L-DEX LYMPHEDEMA SCREENING   Measurement Type Unilateral    L-DEX MEASUREMENT EXTREMITY Upper Extremity    POSITION  Standing    DOMINANT SIDE Right    At Risk Side Left    BASELINE SCORE (UNILATERAL) -0.4    L-DEX SCORE (UNILATERAL) 1.9    VALUE CHANGE (UNILAT) 2.3    Comment Weight 118.2lbs               [employer], PT 11/02/2023, 4:41 PM

## 2024-01-13 ENCOUNTER — Ambulatory Visit
Admission: RE | Admit: 2024-01-13 | Discharge: 2024-01-13 | Disposition: A | Discharge status: Home / Self Care | Payer: Medicare HMO | Source: Ambulatory Visit | Attending: Adult Health | Admitting: Adult Health

## 2024-01-13 DIAGNOSIS — Z853 Personal history of malignant neoplasm of breast: Secondary | ICD-10-CM | POA: Diagnosis not present

## 2024-01-13 DIAGNOSIS — Z170 Estrogen receptor positive status [ER+]: Secondary | ICD-10-CM

## 2024-01-13 DIAGNOSIS — Z08 Encounter for follow-up examination after completed treatment for malignant neoplasm: Secondary | ICD-10-CM | POA: Diagnosis not present

## 2024-01-26 ENCOUNTER — Telehealth: Payer: Self-pay | Admitting: *Deleted

## 2024-01-26 NOTE — Telephone Encounter (Signed)
 Received call from pt stating she will run out of Anastrozole  for a few days before she receives her next mail order dose.  Per MD okay for pt to be off Anastrozole  x1 week with no negative impact to her health while she wait for script to come.  Pt educated and verbalized understanding.

## 2024-02-14 ENCOUNTER — Ambulatory Visit: Payer: Medicare HMO | Attending: General Surgery

## 2024-02-14 VITALS — Wt 118.2 lb

## 2024-02-14 DIAGNOSIS — Z483 Aftercare following surgery for neoplasm: Secondary | ICD-10-CM | POA: Insufficient documentation

## 2024-02-14 NOTE — Therapy (Signed)
 OUTPATIENT PHYSICAL THERAPY SOZO SCREENING NOTE   Patient Name: Caitlin Burch MRN: [id number] DOB:[birth year], 68 y.o., female Today's Date: 02/14/2024  PCP: Homero Luster, MD REFERRING PROVIDER: Lockie Rima, MD   PT End of Session - 02/14/24 1543     Visit Number 2   # unchanged due to screen only   PT Start Time 1541    PT Stop Time 1544    PT Time Calculation (min) 3 min    Activity Tolerance Patient tolerated treatment well    Behavior During Therapy [facility] for tasks assessed/performed             Past Medical History:  Diagnosis Date   Breast cancer (HCC)    Hypertension    MVP (mitral valve prolapse)    questionable   Past Surgical History:  Procedure Laterality Date   BREAST BIOPSY Left 01/29/2023   US  LT BREAST BX W LOC DEV 1ST LESION IMG BX SPEC US  GUIDE 01/29/2023 [facility]   BREAST BIOPSY  02/23/2023   MM LT RADIOACTIVE SEED LOC MAMMO GUIDE 02/23/2023 [facility]   BREAST LUMPECTOMY WITH RADIOACTIVE SEED AND SENTINEL LYMPH NODE BIOPSY Left 02/23/2023   Procedure: LEFT BREAST LUMPECTOMY WITH RADIOACTIVE SEED;  Surgeon: Lockie Rima, MD;  [facility];  Service: General;  Laterality: Left;  90 MIN ROOM 2   MASTECTOMY W/ SENTINEL NODE BIOPSY Left 02/23/2023   Procedure: SENTINEL LYMPH NODE BIOPSY;  Surgeon: Lockie Rima, MD;  [facility];  Service: General;  Laterality: Left;   no surgical hx     Patient Active Problem List   Diagnosis Date Noted   Genetic testing 05/27/2023   Osteoporosis 05/12/2023   Malignant neoplasm of upper-inner quadrant of left breast in female, estrogen receptor positive (HCC) 02/08/2023   Chronic systolic CHF (congestive heart failure) (HCC) 09/21/2014   Chronic renal failure, stage 4 (severe) (HCC) 09/21/2014    REFERRING DIAG: left breast cancer at risk for lymphedema  THERAPY DIAG:  Aftercare following surgery for neoplasm  PERTINENT HISTORY: Left lumpectomy on 02/23/23 with 4 negative nodes removed.   Patient was diagnosed on 01/11/2023 with left grade 2 invasive ductal carcinoma breast cancer. It measures 4 mm and is located in the upper inner quadrant. It is triple positive with a Ki67 of 30%.   PRECAUTIONS: left UE Lymphedema risk  SUBJECTIVE: Pt returns for her 3 month L-Dex screen. "I had a good visit in [place] with my family. We stayed for 2 months."   PAIN:  Are you having pain? No  SOZO SCREENING: Patient was assessed today using the SOZO machine to determine the lymphedema index score. This was compared to her baseline score. It was determined that she is within the recommended range when compared to her baseline and no further action is needed at this time. She will continue SOZO screenings. These are done every 3 months for 2 years post operatively followed by every 6 months for 2 years, and then annually.   L-DEX FLOWSHEETS - 02/14/24 1500       L-DEX LYMPHEDEMA SCREENING   Measurement Type Unilateral    L-DEX MEASUREMENT EXTREMITY Upper Extremity    POSITION  Standing    DOMINANT SIDE Right    At Risk Side Left    BASELINE SCORE (UNILATERAL) -0.4    L-DEX SCORE (UNILATERAL) -1.6    VALUE CHANGE (UNILAT) -1.2               Denyce Flank, PTA 02/14/2024, 3:45  PM

## 2024-04-03 DIAGNOSIS — C50212 Malignant neoplasm of upper-inner quadrant of left female breast: Secondary | ICD-10-CM | POA: Diagnosis not present

## 2024-04-03 DIAGNOSIS — M810 Age-related osteoporosis without current pathological fracture: Secondary | ICD-10-CM | POA: Diagnosis not present

## 2024-04-03 DIAGNOSIS — N184 Chronic kidney disease, stage 4 (severe): Secondary | ICD-10-CM | POA: Diagnosis not present

## 2024-04-03 DIAGNOSIS — Z170 Estrogen receptor positive status [ER+]: Secondary | ICD-10-CM | POA: Diagnosis not present

## 2024-04-17 ENCOUNTER — Other Ambulatory Visit: Payer: Medicare HMO

## 2024-04-17 ENCOUNTER — Ambulatory Visit (HOSPITAL_BASED_OUTPATIENT_CLINIC_OR_DEPARTMENT_OTHER)
Admission: RE | Admit: 2024-04-17 | Discharge: 2024-04-17 | Disposition: A | Discharge status: Home / Self Care | Source: Ambulatory Visit | Attending: Adult Health | Admitting: Adult Health

## 2024-04-17 DIAGNOSIS — M810 Age-related osteoporosis without current pathological fracture: Secondary | ICD-10-CM | POA: Diagnosis not present

## 2024-04-23 ENCOUNTER — Other Ambulatory Visit: Payer: Self-pay | Admitting: Hematology and Oncology

## 2024-04-24 ENCOUNTER — Other Ambulatory Visit: Payer: Self-pay

## 2024-04-24 ENCOUNTER — Encounter: Payer: Self-pay | Admitting: Hematology and Oncology

## 2024-04-24 DIAGNOSIS — C50212 Malignant neoplasm of upper-inner quadrant of left female breast: Secondary | ICD-10-CM

## 2024-04-24 NOTE — Assessment & Plan Note (Signed)
 02/26/2023:Left lumpectomy: Grade 2 IDC 0.4 cm with DCIS, margins negative, LVI not identified, ER 90%, PR 30%, HER2 3+ positive, Ki-67 30%, 0/4 lymph nodes negative  Since the final tumor size is less than 0.5 cm there is no role of adjuvant anti-HER2 based chemotherapy.  04/15/2023-05/11/2023: Adjuvant radiation   Current treatment: Adjuvant antiestrogen therapy with anastrozole  1 mg daily x 5 years   Anastrozole  Toxicities:  Breast Cancer Surveillance: Mammogram: 01/13/24: Benign Breast Exam: 04/25/24: Benign  RTC in 1 year

## 2024-04-25 ENCOUNTER — Inpatient Hospital Stay: Payer: Medicare HMO | Attending: Hematology and Oncology

## 2024-04-25 ENCOUNTER — Inpatient Hospital Stay (HOSPITAL_BASED_OUTPATIENT_CLINIC_OR_DEPARTMENT_OTHER): Payer: Medicare HMO | Admitting: Hematology and Oncology

## 2024-04-25 ENCOUNTER — Inpatient Hospital Stay: Payer: Medicare HMO

## 2024-04-25 VITALS — BP 109/51 | HR 64 | Temp 98.2°F | Resp 16 | Wt 119.9 lb

## 2024-04-25 DIAGNOSIS — M810 Age-related osteoporosis without current pathological fracture: Secondary | ICD-10-CM | POA: Diagnosis not present

## 2024-04-25 DIAGNOSIS — M722 Plantar fascial fibromatosis: Secondary | ICD-10-CM

## 2024-04-25 DIAGNOSIS — C50212 Malignant neoplasm of upper-inner quadrant of left female breast: Secondary | ICD-10-CM | POA: Insufficient documentation

## 2024-04-25 DIAGNOSIS — Z170 Estrogen receptor positive status [ER+]: Secondary | ICD-10-CM | POA: Insufficient documentation

## 2024-04-25 LAB — CBC WITH DIFFERENTIAL (CANCER CENTER ONLY)
Abs Immature Granulocytes: 0.02 K/uL (ref 0.00–0.07)
Basophils Absolute: 0 K/uL (ref 0.0–0.1)
Basophils Relative: 1 %
Eosinophils Absolute: 0.1 K/uL (ref 0.0–0.5)
Eosinophils Relative: 2 %
HCT: 39.8 % (ref 36.0–46.0)
Hemoglobin: 13 g/dL (ref 12.0–15.0)
Immature Granulocytes: 0 %
Lymphocytes Relative: 27 %
Lymphs Abs: 1.8 K/uL (ref 0.7–4.0)
MCH: 30.8 pg (ref 26.0–34.0)
MCHC: 32.7 g/dL (ref 30.0–36.0)
MCV: 94.3 fL (ref 80.0–100.0)
Monocytes Absolute: 0.4 K/uL (ref 0.1–1.0)
Monocytes Relative: 6 %
Neutro Abs: 4.1 K/uL (ref 1.7–7.7)
Neutrophils Relative %: 64 %
Platelet Count: 232 K/uL (ref 150–400)
RBC: 4.22 MIL/uL (ref 3.87–5.11)
RDW: 12.2 % (ref 11.5–15.5)
WBC Count: 6.5 K/uL (ref 4.0–10.5)
nRBC: 0 % (ref 0.0–0.2)

## 2024-04-25 LAB — CMP (CANCER CENTER ONLY)
ALT: 13 U/L (ref 0–44)
AST: 18 U/L (ref 15–41)
Albumin: 4.4 g/dL (ref 3.5–5.0)
Alkaline Phosphatase: 50 U/L (ref 38–126)
Anion gap: 4 — ABNORMAL LOW (ref 5–15)
BUN: 15 mg/dL (ref 8–23)
CO2: 31 mmol/L (ref 22–32)
Calcium: 9.7 mg/dL (ref 8.9–10.3)
Chloride: 105 mmol/L (ref 98–111)
Creatinine: 0.84 mg/dL (ref 0.44–1.00)
GFR, Estimated: >60 mL/min (ref >60)
Glucose, Bld: 97 mg/dL (ref 70–99)
Potassium: 4.5 mmol/L (ref 3.5–5.1)
Sodium: 140 mmol/L (ref 135–145)
Total Bilirubin: 0.7 mg/dL (ref 0.0–1.2)
Total Protein: 7.7 g/dL (ref 6.5–8.1)

## 2024-04-25 MED ORDER — DENOSUMAB 60 MG/ML ~~LOC~~ SOSY
60.0000 mg | PREFILLED_SYRINGE | Freq: Once | SUBCUTANEOUS | Status: AC
Start: 2024-04-25 — End: 2024-04-25
  Administered 2024-04-25: 60 mg via SUBCUTANEOUS
  Filled 2024-04-25: qty 1

## 2024-04-25 NOTE — Progress Notes (Signed)
 Patient Care Team: Kiki Familia, MD as PCP - General (Family Medicine) Aron Shoulders, MD as Consulting Physician (General Surgery) Odean Potts, MD as Consulting Physician (Hematology and Oncology) Izell Domino, MD as Attending Physician (Radiation Oncology)  DIAGNOSIS:  Encounter Diagnosis  Name Primary?   Malignant neoplasm of upper-inner quadrant of left breast in female, estrogen receptor positive (HCC) Yes    SUMMARY OF ONCOLOGIC HISTORY: Oncology History  Malignant neoplasm of upper-inner quadrant of left breast in female, estrogen receptor positive (HCC)  01/29/2023 Initial Diagnosis   Screening mammogram detected left breast mass at 9:30 position measuring 0.4 cm, ultrasound-guided biopsy: Grade 2 IDC ER 90%, PR 30%, HER2 positive 3+ by IHC, Ki-67 30%   02/10/2023 Cancer Staging   Staging form: Breast, AJCC 8th Edition - Clinical: Stage IA (cT1a, cN0, cM0, G2, ER+, PR+, HER2+) - Signed by Odean Potts, MD on 02/10/2023 Histologic grading system: 3 grade system   02/26/2023 Surgery   Left lumpectomy: Grade 2 IDC 0.4 cm with DCIS, margins negative, LVI not identified, ER 90%, PR 30%, HER2 3+ positive, Ki-67 30%, 0/4 lymph nodes negative   04/14/2023 - 05/11/2023 Radiation Therapy   Plan Name: Breast_L Site: Breast, Left Technique: 3D Mode: Photon Dose Per Fraction: 2.67 Gy Prescribed Dose (Delivered / Prescribed): 40.05 Gy / 40.05 Gy Prescribed Fxs (Delivered / Prescribed): 15 / 15   Plan Name: Breast_L_Bst Site: Breast, Left Technique: 3D Mode: Photon Dose Per Fraction: 2 Gy Prescribed Dose (Delivered / Prescribed): 10 Gy / 10 Gy Prescribed Fxs (Delivered / Prescribed): 5 / 5   04/15/2023 - 05/11/2023 Radiation Therapy   Adjuvant radiation   05/12/2023 -  Anti-estrogen oral therapy   Anastrozole  1 mg daily    Genetic Testing   [facility] CancerNext-Expanded Panel+RNA was Negative. Report date is 05/26/2023.   The CancerNext-Expanded gene panel offered by [facility]  and includes sequencing, rearrangement, and RNA analysis for the following 71 genes: AIP, ALK, APC, ATM, AXIN2, BAP1, BARD1, BMPR1A, BRCA1, BRCA2, BRIP1, CDC73, CDH1, CDK4, CDKN1B, CDKN2A, CHEK2, CTNNA1, DICER1, FH, FLCN, KIF1B, LZTR1, MAX, MEN1, MET, MLH1, MSH2, MSH3, MSH6, MUTYH, NF1, NF2, NTHL1, PALB2, PHOX2B, PMS2, POT1, PRKAR1A, PTCH1, PTEN, RAD51C, RAD51D, RB1, RET, SDHA, SDHAF2, SDHB, SDHC, SDHD, SMAD4, SMARCA4, SMARCB1, SMARCE1, STK11, SUFU, TMEM127, TP53, TSC1, TSC2, and VHL (sequencing and deletion/duplication); EGFR, EGLN1, HOXB13, KIT, MITF, PDGFRA, POLD1, and POLE (sequencing only); EPCAM and GREM1 (deletion/duplication only).    05/2023 -  Anti-estrogen oral therapy   1 mg Anastrozole  daily x 5 years   08/23/2023 Cancer Staging   Staging form: Breast, AJCC 8th Edition - Pathologic: Stage IA (pT1a, pN0, cM0, G2, ER+, PR+, HER2+) - Signed by Crawford Morna Pickle, NP on 08/23/2023 Histologic grading system: 3 grade system     CHIEF COMPLIANT:   HISTORY OF PRESENT ILLNESS:  History of Present Illness Caitlin Burch is a 68 year old female with breast cancer who presents for follow-up after surgery and ongoing management of osteoporosis.  She underwent surgery in May 2024 and experiences occasional pain. She continues anastrozole  therapy, experiencing occasional joint stiffness and achiness. Her osteoporosis is managed with Prolia  injections, which she tolerates well. Her bone density has improved from minus 3.4 to minus 2.5 since starting Prolia .     ALLERGIES:  has no known allergies.  MEDICATIONS:  Current Outpatient Medications  Medication Sig Dispense Refill   anastrozole  (ARIMIDEX ) 1 MG tablet TAKE 1 TABLET DAILY 90 tablet 3   atorvastatin (LIPITOR) 10 MG tablet Take  10 mg by mouth daily.     CALCIUM PO Take 1 tablet by mouth daily.     Cholecalciferol (VITAMIN D3) 1000 units CAPS Take 1,000 Units by mouth daily.     Cyanocobalamin (B-12 PO) Take 1 tablet by mouth  daily.     Ferrous Sulfate (IRON) 325 (65 Fe) MG TABS Take 325 mg by mouth daily.     lisinopril  (PRINIVIL ,ZESTRIL ) 20 MG tablet TAKE 1 TABLET (20 MG TOTAL) BY MOUTH DAILY. 90 tablet 0   metoprolol  succinate (TOPROL -XL) 25 MG 24 hr tablet TAKE 1 TABLET (25 MG TOTAL) BY MOUTH DAILY. 90 tablet 0   Multiple Vitamin (MULTIVITAMIN) capsule Take 1 capsule by mouth daily.     No current facility-administered medications for this visit.    PHYSICAL EXAMINATION: ECOG PERFORMANCE STATUS: 1 - Symptomatic but completely ambulatory  Vitals:   04/25/24 1116  BP: (!) 109/51  Pulse: 64  Resp: 16  Temp: 98.2 F (36.8 C)  SpO2: 100%   Filed Weights   04/25/24 1116  Weight: 119 lb 14.4 oz (54.4 kg)    Physical Exam   (exam performed in the presence of a chaperone)  LABORATORY DATA:  I have reviewed the data as listed    Latest Ref Rng & Units 04/25/2024   10:57 AM 10/26/2023   11:00 AM 05/26/2023   11:03 AM  CMP  Glucose 70 - 99 mg/dL 97  90  899   BUN 8 - 23 mg/dL 15  16  12    Creatinine 0.44 - 1.00 mg/dL 9.15  9.20  9.18   Sodium 135 - 145 mmol/L 140  140  139   Potassium 3.5 - 5.1 mmol/L 4.5  4.4  4.2   Chloride 98 - 111 mmol/L 105  103  105   CO2 22 - 32 mmol/L 31  31  30    Calcium 8.9 - 10.3 mg/dL 9.7  89.3  9.8   Total Protein 6.5 - 8.1 g/dL 7.7  7.9  7.9   Total Bilirubin 0.0 - 1.2 mg/dL 0.7  0.6  0.8   Alkaline Phos 38 - 126 U/L 50  62  80   AST 15 - 41 U/L 18  19  17    ALT 0 - 44 U/L 13  14  14      Lab Results  Component Value Date   WBC 6.5 04/25/2024   HGB 13.0 04/25/2024   HCT 39.8 04/25/2024   MCV 94.3 04/25/2024   PLT 232 04/25/2024   NEUTROABS 4.1 04/25/2024    ASSESSMENT & PLAN:  Malignant neoplasm of upper-inner quadrant of left breast in female, estrogen receptor positive (HCC) 02/26/2023:Left lumpectomy: Grade 2 IDC 0.4 cm with DCIS, margins negative, LVI not identified, ER 90%, PR 30%, HER2 3+ positive, Ki-67 30%, 0/4 lymph nodes negative  Since the  final tumor size is less than 0.5 cm there is no role of adjuvant anti-HER2 based chemotherapy.  04/15/2023-05/11/2023: Adjuvant radiation   Current treatment: Adjuvant antiestrogen therapy with anastrozole  1 mg daily x 5 years started September 2024   Anastrozole  Toxicities: Intermittent hot flashes Mild joint stiffness  Breast Cancer Surveillance: Mammogram: 01/13/24: Benign Breast Exam: 04/25/24: Benign  Osteoporosis: Bone density: Improved T-score from -3.4 to -2.5. Prolia  well-tolerated, normal hematological and renal/hepatic function. - Continue Prolia  injections every six months. - Continue vitamin D and calcium supplementation.   Assessment & Plan  Plantar fasciitis Intermittent foot pain likely due to plantar fasciitis, exacerbated by improper footwear. - Advise  wearing shoes with cushioning. - Refer to orthopedics for further evaluation and management and consideration for injections   Return to clinic in 1 year for follow-up   No orders of the defined types were placed in this encounter.  The patient has a good understanding of the overall plan. she agrees with it. she will call with any problems that may develop before the next visit here. Total time spent: 30 mins including face to face time and time spent for planning, charting and co-ordination of care   Caitlin MARLA Chad, MD 04/25/24

## 2024-04-26 DIAGNOSIS — C50919 Malignant neoplasm of unspecified site of unspecified female breast: Secondary | ICD-10-CM | POA: Diagnosis not present

## 2024-04-26 DIAGNOSIS — Z008 Encounter for other general examination: Secondary | ICD-10-CM | POA: Diagnosis not present

## 2024-04-26 DIAGNOSIS — I700 Atherosclerosis of aorta: Secondary | ICD-10-CM | POA: Diagnosis not present

## 2024-04-26 DIAGNOSIS — E785 Hyperlipidemia, unspecified: Secondary | ICD-10-CM | POA: Diagnosis not present

## 2024-04-26 DIAGNOSIS — Z79811 Long term (current) use of aromatase inhibitors: Secondary | ICD-10-CM | POA: Diagnosis not present

## 2024-04-26 DIAGNOSIS — R7303 Prediabetes: Secondary | ICD-10-CM | POA: Diagnosis not present

## 2024-04-26 DIAGNOSIS — Z809 Family history of malignant neoplasm, unspecified: Secondary | ICD-10-CM | POA: Diagnosis not present

## 2024-04-26 DIAGNOSIS — I10 Essential (primary) hypertension: Secondary | ICD-10-CM | POA: Diagnosis not present

## 2024-04-26 DIAGNOSIS — K760 Fatty (change of) liver, not elsewhere classified: Secondary | ICD-10-CM | POA: Diagnosis not present

## 2024-04-26 DIAGNOSIS — M810 Age-related osteoporosis without current pathological fracture: Secondary | ICD-10-CM | POA: Diagnosis not present

## 2024-04-26 DIAGNOSIS — Z8249 Family history of ischemic heart disease and other diseases of the circulatory system: Secondary | ICD-10-CM | POA: Diagnosis not present

## 2024-04-26 DIAGNOSIS — D6949 Other primary thrombocytopenia: Secondary | ICD-10-CM | POA: Diagnosis not present

## 2024-04-26 DIAGNOSIS — H9193 Unspecified hearing loss, bilateral: Secondary | ICD-10-CM | POA: Diagnosis not present

## 2024-05-15 ENCOUNTER — Ambulatory Visit: Attending: General Surgery

## 2024-05-15 VITALS — Wt 120.0 lb

## 2024-05-15 DIAGNOSIS — Z483 Aftercare following surgery for neoplasm: Secondary | ICD-10-CM | POA: Insufficient documentation

## 2024-05-15 NOTE — Therapy (Signed)
  OUTPATIENT PHYSICAL THERAPY SOZO SCREENING NOTE   Patient Name: Caitlin Burch MRN: [id number] DOB:12/08/1955, 68 y.o., female Today's Date: 05/15/2024  PCP: Kiki Familia, MD REFERRING PROVIDER: Aron Shoulders, MD   PT End of Session - 05/15/24 1546     Visit Number 2   # unchanged due to screen only   PT Start Time 1544    PT Stop Time 1548    PT Time Calculation (min) 4 min    Activity Tolerance Patient tolerated treatment well    Behavior During Therapy [facility] for tasks assessed/performed          Past Medical History:  Diagnosis Date   Breast cancer (HCC)    Hypertension    MVP (mitral valve prolapse)    questionable   Past Surgical History:  Procedure Laterality Date   BREAST BIOPSY Left 01/29/2023   US  LT BREAST BX W LOC DEV 1ST LESION IMG BX SPEC US  GUIDE 01/29/2023 [facility]   BREAST BIOPSY  02/23/2023   MM LT RADIOACTIVE SEED LOC MAMMO GUIDE 02/23/2023 [facility]   BREAST LUMPECTOMY WITH RADIOACTIVE SEED AND SENTINEL LYMPH NODE BIOPSY Left 02/23/2023   Procedure: LEFT BREAST LUMPECTOMY WITH RADIOACTIVE SEED;  Surgeon: Aron Shoulders, MD;  Location: [facility];  Service: General;  Laterality: Left;  90 MIN ROOM 2   MASTECTOMY W/ SENTINEL NODE BIOPSY Left 02/23/2023   Procedure: SENTINEL LYMPH NODE BIOPSY;  Surgeon: Aron Shoulders, MD;  Location: [facility];  Service: General;  Laterality: Left;   no surgical hx     Patient Active Problem List   Diagnosis Date Noted   Genetic testing 05/27/2023   Osteoporosis 05/12/2023   Malignant neoplasm of upper-inner quadrant of left breast in female, estrogen receptor positive (HCC) 02/08/2023   Chronic systolic CHF (congestive heart failure) (HCC) 09/21/2014   Chronic renal failure, stage 4 (severe) (HCC) 09/21/2014    REFERRING DIAG: left breast cancer at risk for lymphedema  THERAPY DIAG:  Aftercare following surgery for neoplasm  PERTINENT HISTORY: Left lumpectomy on 02/23/23 with 4 negative nodes removed.   Patient was diagnosed on 01/11/2023 with left grade 2 invasive ductal carcinoma breast cancer. It measures 4 mm and is located in the upper inner quadrant. It is triple positive with a Ki67 of 30%.   PRECAUTIONS: left UE Lymphedema risk  SUBJECTIVE: Pt returns for her 3 month L-Dex screen. I'm going to [place] next week.    PAIN:  Are you having pain? No  SOZO SCREENING: Patient was assessed today using the SOZO machine to determine the lymphedema index score. This was compared to her baseline score. It was determined that she is within the recommended range when compared to her baseline and no further action is needed at this time. She will continue SOZO screenings. These are done every 3 months for 2 years post operatively followed by every 6 months for 2 years, and then annually.   L-DEX FLOWSHEETS - 05/15/24 1500       L-DEX LYMPHEDEMA SCREENING   Measurement Type Unilateral    L-DEX MEASUREMENT EXTREMITY Upper Extremity    POSITION  Standing    DOMINANT SIDE Right    At Risk Side Left    BASELINE SCORE (UNILATERAL) -0.4    L-DEX SCORE (UNILATERAL) -1.4    VALUE CHANGE (UNILAT) -1            Aden Berwyn Caldron, PTA 05/15/2024, 3:47 PM

## 2024-06-20 DIAGNOSIS — Z0000 Encounter for general adult medical examination without abnormal findings: Secondary | ICD-10-CM | POA: Diagnosis not present

## 2024-08-14 ENCOUNTER — Ambulatory Visit: Attending: General Surgery

## 2024-08-14 VITALS — Wt 119.0 lb

## 2024-08-14 DIAGNOSIS — Z483 Aftercare following surgery for neoplasm: Secondary | ICD-10-CM | POA: Insufficient documentation

## 2024-08-14 NOTE — Therapy (Signed)
 OUTPATIENT PHYSICAL THERAPY SOZO SCREENING NOTE   Patient Name: Caitlin Burch MRN: [id number] DOB:April 11, 1956, 68 y.o., female Today's Date: 08/14/2024  PCP: Kiki Familia, MD REFERRING PROVIDER: Aron Shoulders, MD   PT End of Session - 08/14/24 1544     Visit Number 2   # unchanged due to screen only   PT Start Time 1543    PT Stop Time 1547    PT Time Calculation (min) 4 min    Activity Tolerance Patient tolerated treatment well    Behavior During Therapy [facility] for tasks assessed/performed          Past Medical History:  Diagnosis Date   Breast cancer (HCC)    Hypertension    MVP (mitral valve prolapse)    questionable   Past Surgical History:  Procedure Laterality Date   BREAST BIOPSY Left 01/29/2023   US  LT BREAST BX W LOC DEV 1ST LESION IMG BX SPEC US  GUIDE 01/29/2023 [facility]   BREAST BIOPSY  02/23/2023   MM LT RADIOACTIVE SEED LOC MAMMO GUIDE 02/23/2023 [facility]   BREAST LUMPECTOMY WITH RADIOACTIVE SEED AND SENTINEL LYMPH NODE BIOPSY Left 02/23/2023   Procedure: LEFT BREAST LUMPECTOMY WITH RADIOACTIVE SEED;  Surgeon: Aron Shoulders, MD;  [facility];  Service: General;  Laterality: Left;  90 MIN ROOM 2   MASTECTOMY W/ SENTINEL NODE BIOPSY Left 02/23/2023   Procedure: SENTINEL LYMPH NODE BIOPSY;  Surgeon: Aron Shoulders, MD;  [facility]: [facility];  Service: General;  Laterality: Left;   no surgical hx     Patient Active Problem List   Diagnosis Date Noted   Genetic testing 05/27/2023   Osteoporosis 05/12/2023   Malignant neoplasm of upper-inner quadrant of left breast in female, estrogen receptor positive (HCC) 02/08/2023   Chronic systolic CHF (congestive heart failure) (HCC) 09/21/2014   Chronic renal failure, stage 4 (severe) (HCC) 09/21/2014    REFERRING DIAG: left breast cancer at risk for lymphedema  THERAPY DIAG: Aftercare following surgery for neoplasm  PERTINENT HISTORY: Left lumpectomy on 02/23/23 with 4 negative nodes removed.  Patient  was diagnosed on 01/11/2023 with left grade 2 invasive ductal carcinoma breast cancer. It measures 4 mm and is located in the upper inner quadrant. It is triple positive with a Ki67 of 30%.   PRECAUTIONS: left UE Lymphedema risk  SUBJECTIVE: Pt returns for her 3 month L-Dex screen.   PAIN:  Are you having pain? No  SOZO SCREENING: Patient was assessed today using the SOZO machine to determine the lymphedema index score. This was compared to her baseline score. It was determined that she is within the recommended range when compared to her baseline and no further action is needed at this time. She will continue SOZO screenings. These are done every 3 months for 2 years post operatively followed by every 6 months for 2 years, and then annually.   L-DEX FLOWSHEETS - 08/14/24 1500       L-DEX LYMPHEDEMA SCREENING   Measurement Type Unilateral    L-DEX MEASUREMENT EXTREMITY Upper Extremity    POSITION  Standing    DOMINANT SIDE Right    At Risk Side Left    BASELINE SCORE (UNILATERAL) -0.4    L-DEX SCORE (UNILATERAL) 0    VALUE CHANGE (UNILAT) 0.4         P: Cont 3 month L-Dex screens until 02/2025, then transition to 6 month L-Dex screens until 02/2027.    Aden Berwyn Caldron, PTA 08/14/2024, 3:46 PM

## 2024-10-25 ENCOUNTER — Other Ambulatory Visit: Payer: Self-pay

## 2024-10-25 DIAGNOSIS — C50212 Malignant neoplasm of upper-inner quadrant of left female breast: Secondary | ICD-10-CM

## 2024-10-26 ENCOUNTER — Inpatient Hospital Stay

## 2024-10-26 ENCOUNTER — Inpatient Hospital Stay: Attending: Hematology and Oncology

## 2024-10-26 VITALS — BP 121/78 | HR 95 | Temp 98.0°F | Resp 17

## 2024-10-26 DIAGNOSIS — C50212 Malignant neoplasm of upper-inner quadrant of left female breast: Secondary | ICD-10-CM

## 2024-10-26 DIAGNOSIS — M810 Age-related osteoporosis without current pathological fracture: Secondary | ICD-10-CM

## 2024-10-26 LAB — CMP (CANCER CENTER ONLY)
ALT: 17 U/L (ref 0–44)
AST: 23 U/L (ref 15–41)
Albumin: 4.5 g/dL (ref 3.5–5.0)
Alkaline Phosphatase: 58 U/L (ref 38–126)
Anion gap: 10 (ref 5–15)
BUN: 15 mg/dL (ref 8–23)
CO2: 26 mmol/L (ref 22–32)
Calcium: 9.8 mg/dL (ref 8.9–10.3)
Chloride: 104 mmol/L (ref 98–111)
Creatinine: 0.85 mg/dL (ref 0.44–1.00)
GFR, Estimated: >60 mL/min
Glucose, Bld: 102 mg/dL — ABNORMAL HIGH (ref 70–99)
Potassium: 4.5 mmol/L (ref 3.5–5.1)
Sodium: 139 mmol/L (ref 135–145)
Total Bilirubin: 0.4 mg/dL (ref 0.0–1.2)
Total Protein: 7.7 g/dL (ref 6.5–8.1)

## 2024-10-26 LAB — CBC WITH DIFFERENTIAL (CANCER CENTER ONLY)
Abs Immature Granulocytes: 0.02 K/uL (ref 0.00–0.07)
Basophils Absolute: 0 K/uL (ref 0.0–0.1)
Basophils Relative: 0 %
Eosinophils Absolute: 0.1 K/uL (ref 0.0–0.5)
Eosinophils Relative: 2 %
HCT: 40.1 % (ref 36.0–46.0)
Hemoglobin: 13.8 g/dL (ref 12.0–15.0)
Immature Granulocytes: 0 %
Lymphocytes Relative: 30 %
Lymphs Abs: 2.6 K/uL (ref 0.7–4.0)
MCH: 32.1 pg (ref 26.0–34.0)
MCHC: 34.4 g/dL (ref 30.0–36.0)
MCV: 93.3 fL (ref 80.0–100.0)
Monocytes Absolute: 0.6 K/uL (ref 0.1–1.0)
Monocytes Relative: 7 %
Neutro Abs: 5.2 K/uL (ref 1.7–7.7)
Neutrophils Relative %: 61 %
Platelet Count: 240 K/uL (ref 150–400)
RBC: 4.3 MIL/uL (ref 3.87–5.11)
RDW: 12 % (ref 11.5–15.5)
WBC Count: 8.5 K/uL (ref 4.0–10.5)
nRBC: 0 % (ref 0.0–0.2)

## 2024-10-26 MED ORDER — DENOSUMAB 60 MG/ML ~~LOC~~ SOSY
60.0000 mg | PREFILLED_SYRINGE | Freq: Once | SUBCUTANEOUS | Status: AC
  Administered 2024-10-26: 60 mg via SUBCUTANEOUS
  Filled 2024-10-26: qty 1

## 2024-11-13 ENCOUNTER — Ambulatory Visit: Attending: General Surgery

## 2025-04-25 ENCOUNTER — Inpatient Hospital Stay

## 2025-04-25 ENCOUNTER — Inpatient Hospital Stay: Admitting: Hematology and Oncology
